# Patient Record
Sex: Male | Born: 1985 | Race: Black or African American | Hispanic: No | Marital: Single | Smoking: Current every day smoker
Health system: Southern US, Community
[De-identification: ages and names within clinical notes are randomized; demographics above are authoritative.]

## PROBLEM LIST (undated history)

## (undated) DIAGNOSIS — F1010 Alcohol abuse, uncomplicated: Secondary | ICD-10-CM

## (undated) DIAGNOSIS — I10 Essential (primary) hypertension: Secondary | ICD-10-CM

---
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
## 2007-03-02 ENCOUNTER — Emergency Department (HOSPITAL_COMMUNITY): Admission: EM | Admit: 2007-03-02 | Discharge: 2007-03-02 | Payer: Self-pay | Admitting: Emergency Medicine

## 2007-07-12 ENCOUNTER — Emergency Department (HOSPITAL_COMMUNITY): Admission: EM | Admit: 2007-07-12 | Discharge: 2007-07-12 | Payer: Self-pay | Admitting: Emergency Medicine

## 2008-05-29 ENCOUNTER — Emergency Department (HOSPITAL_COMMUNITY): Admission: EM | Admit: 2008-05-29 | Discharge: 2008-05-29 | Payer: Self-pay | Admitting: Emergency Medicine

## 2013-08-02 ENCOUNTER — Emergency Department (HOSPITAL_COMMUNITY): Payer: Self-pay

## 2013-08-02 ENCOUNTER — Encounter (HOSPITAL_COMMUNITY): Payer: Self-pay | Admitting: Emergency Medicine

## 2013-08-02 ENCOUNTER — Emergency Department (HOSPITAL_COMMUNITY)
Admission: EM | Admit: 2013-08-02 | Discharge: 2013-08-02 | Disposition: A | Discharge status: Home / Self Care | Payer: Self-pay | Attending: Emergency Medicine | Admitting: Emergency Medicine

## 2013-08-02 DIAGNOSIS — Y9389 Activity, other specified: Secondary | ICD-10-CM | POA: Insufficient documentation

## 2013-08-02 DIAGNOSIS — Y929 Unspecified place or not applicable: Secondary | ICD-10-CM | POA: Insufficient documentation

## 2013-08-02 DIAGNOSIS — S0181XA Laceration without foreign body of other part of head, initial encounter: Secondary | ICD-10-CM

## 2013-08-02 DIAGNOSIS — S0990XA Unspecified injury of head, initial encounter: Secondary | ICD-10-CM | POA: Insufficient documentation

## 2013-08-02 DIAGNOSIS — F17200 Nicotine dependence, unspecified, uncomplicated: Secondary | ICD-10-CM | POA: Insufficient documentation

## 2013-08-02 DIAGNOSIS — W2209XA Striking against other stationary object, initial encounter: Secondary | ICD-10-CM | POA: Insufficient documentation

## 2013-08-02 DIAGNOSIS — S0180XA Unspecified open wound of other part of head, initial encounter: Secondary | ICD-10-CM | POA: Insufficient documentation

## 2013-08-02 MED ORDER — TETANUS-DIPHTH-ACELL PERTUSSIS 5-2.5-18.5 LF-MCG/0.5 IM SUSP
0.5000 mL | Freq: Once | INTRAMUSCULAR | Status: AC
  Administered 2013-08-02: 0.5 mL via INTRAMUSCULAR
  Filled 2013-08-02: qty 0.5

## 2013-08-02 MED ORDER — LIDOCAINE-EPINEPHRINE (PF) 1 %-1:200000 IJ SOLN
INTRAMUSCULAR | Status: AC
  Administered 2013-08-02: 07:00:00
  Filled 2013-08-02: qty 10

## 2013-08-02 NOTE — Discharge Instructions (Signed)

## 2013-08-02 NOTE — ED Notes (Signed)
Pressure bandage applied to laceration. Pt reports ETOH on board.
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
## 2019-01-13 ENCOUNTER — Other Ambulatory Visit: Payer: Self-pay

## 2019-01-13 ENCOUNTER — Ambulatory Visit: Payer: Self-pay | Attending: Internal Medicine

## 2019-01-13 DIAGNOSIS — Z20822 Contact with and (suspected) exposure to covid-19: Secondary | ICD-10-CM | POA: Insufficient documentation

## 2019-01-15 ENCOUNTER — Telehealth: Payer: Self-pay | Admitting: *Deleted

## 2019-01-15 LAB — NOVEL CORONAVIRUS, NAA: SARS-CoV-2, NAA: NOT DETECTED

## 2019-01-15 NOTE — Telephone Encounter (Signed)
Patient called given negative covid results . 

## 2019-11-24 ENCOUNTER — Emergency Department (HOSPITAL_COMMUNITY)
Admission: EM | Admit: 2019-11-24 | Discharge: 2019-11-24 | Disposition: A | Discharge status: Home / Self Care | Payer: Self-pay | Attending: Emergency Medicine | Admitting: Emergency Medicine

## 2019-11-24 ENCOUNTER — Other Ambulatory Visit: Payer: Self-pay

## 2019-11-24 ENCOUNTER — Encounter (HOSPITAL_COMMUNITY): Payer: Self-pay | Admitting: *Deleted

## 2019-11-24 DIAGNOSIS — F17200 Nicotine dependence, unspecified, uncomplicated: Secondary | ICD-10-CM | POA: Insufficient documentation

## 2019-11-24 DIAGNOSIS — Z202 Contact with and (suspected) exposure to infections with a predominantly sexual mode of transmission: Secondary | ICD-10-CM | POA: Insufficient documentation

## 2019-11-24 LAB — URINALYSIS, ROUTINE W REFLEX MICROSCOPIC
Bilirubin Urine: NEGATIVE
Glucose, UA: NEGATIVE mg/dL
Hgb urine dipstick: NEGATIVE
Ketones, ur: NEGATIVE mg/dL
Leukocytes,Ua: NEGATIVE
Nitrite: NEGATIVE
Protein, ur: NEGATIVE mg/dL
Specific Gravity, Urine: 1.002 — ABNORMAL LOW (ref 1.005–1.030)
pH: 6 (ref 5.0–8.0)

## 2019-11-24 LAB — HIV ANTIBODY (ROUTINE TESTING W REFLEX): HIV Screen 4th Generation wRfx: NONREACTIVE

## 2019-11-24 MED ORDER — DOXYCYCLINE HYCLATE 100 MG PO CAPS
100.0000 mg | ORAL_CAPSULE | Freq: Two times a day (BID) | ORAL | 0 refills | Status: AC
Start: 2019-11-24 — End: 2019-12-01

## 2019-11-24 MED ORDER — METRONIDAZOLE 500 MG PO TABS: 500.0000 mg | ORAL_TABLET | Freq: Two times a day (BID) | ORAL | 0 refills | Status: DC

## 2019-11-24 MED ORDER — CEFTRIAXONE SODIUM 500 MG IJ SOLR
500.0000 mg | Freq: Once | INTRAMUSCULAR | Status: AC
  Administered 2019-11-24: 500 mg via INTRAMUSCULAR
  Filled 2019-11-24: qty 500

## 2019-11-24 MED ORDER — LIDOCAINE HCL (PF) 1 % IJ SOLN
1.0000 mL | Freq: Once | INTRAMUSCULAR | Status: AC
  Administered 2019-11-24: 1 mL

## 2019-11-24 NOTE — Discharge Instructions (Signed)
Seen here for a STD exposure.  Start you on antibiotics please be aware that doxycycline can make you more susceptible to sunburns please wear sunscreen to protect your face and skin.  I have also started you on Flagyl do not drink alcohol taking this medication as it can have a bad reaction.  Recommend that you abstain from sexual intercourse for 2 weeks times.  Your HIV and syphilis tests are pending at this time.  I have given you information for safe sex practices and you can also follow-up at the health department where they screened and treated for STIs.  Come back to the emergency department if you develop chest pain, shortness of breath, severe abdominal pain, uncontrolled nausea, vomiting, diarrhea.

## 2019-11-24 NOTE — ED Notes (Signed)
ED Provider at bedside. 

## 2019-11-24 NOTE — ED Notes (Signed)
Entered room and introduced self to patient. Pt appears in no acute distress, respirations are even and unlabored with equal chest rise and fall. Bed is locked in the lowest position, side rails x1 at patient request. Educated on call light use and hourly rounding, in agreement at this time. All questions and concerns voiced addressed.  Pt up and to restroom without assistance from staff for urine sample at this time.

## 2019-11-24 NOTE — ED Provider Notes (Signed)
[name]   CSN: [id number] Arrival date & time: 11/24/19  1156     History Chief Complaint  Patient presents with  . Exposure to STD    Carlos Good is a 34 y.o. male.  HPI   Patient with no significant medical history presents to the emergency department with chief complaint of possible STD exposure.  Patient stated that his girlfriend recently tested positive for gonorrhea and he want to be evaluated.  He denies any urinary symptoms, denies dysuria, hematuria, urgency, penile discharge, testicular pain.  He denies flank pain or abdominal pain, nausea, vomiting, diarrhea, no rashes or oral lesions that he is aware of.  Patient denies any alleviating or aggravating factors at this time.  Patient denies headaches, fevers, chills, shortness of breath, chest pain or diarrhea.   History reviewed. No pertinent past medical history.  There are no problems to display for this patient.   History reviewed. No pertinent surgical history.     History reviewed. No pertinent family history.  Social History   Tobacco Use  . Smoking status: Current Every Day Smoker    Packs/day: 1.00  . Smokeless tobacco: Never Used  Substance Use Topics  . Alcohol use: Yes  . Drug use: No    Home Medications Prior to Admission medications   Medication Sig Start Date End Date Taking? Authorizing Provider  doxycycline (VIBRAMYCIN) 100 MG capsule Take 1 capsule (100 mg total) by mouth 2 (two) times daily for 7 days. 11/24/19 12/01/19  Carroll Sage, PA-C  metroNIDAZOLE (FLAGYL) 500 MG tablet Take 1 tablet (500 mg total) by mouth 2 (two) times daily. 11/24/19   Carroll Sage, PA-C    Allergies    Patient has no known allergies.  Review of Systems   Review of Systems  Constitutional: Negative for chills and fever.  HENT: Negative for congestion.   Respiratory: Negative for shortness of breath.   Cardiovascular: Negative for chest pain.    Gastrointestinal: Negative for abdominal pain, diarrhea, nausea and vomiting.  Genitourinary: Negative for discharge, dysuria, enuresis, flank pain and scrotal swelling.  Musculoskeletal: Negative for back pain.  Skin: Negative for rash.  Neurological: Negative for headaches.  Hematological: Does not bruise/bleed easily.    Physical Exam Updated Vital Signs BP (!) 154/93 (BP Location: Right Arm)   Pulse 96   Temp 98.5 F (36.9 C) (Oral)   Resp 18   Ht 6\' 3"  (1.905 m)   Wt 79.4 kg   SpO2 99%   BMI 21.87 kg/m   Physical Exam Vitals and nursing note reviewed. Exam conducted with a chaperone present.  Constitutional:      General: He is not in acute distress.    Appearance: He is not ill-appearing.  HENT:     Head: Normocephalic and atraumatic.     Nose: No congestion.     Mouth/Throat:     Mouth: Mucous membranes are moist.     Pharynx: Oropharynx is clear. No oropharyngeal exudate or posterior oropharyngeal erythema.  Eyes:     Conjunctiva/sclera: Conjunctivae normal.  Cardiovascular:     Rate and Rhythm: Normal rate and regular rhythm.     Pulses: Normal pulses.     Heart sounds: No murmur heard.  No friction rub. No gallop.   Pulmonary:     Effort: No respiratory distress.     Breath sounds: No wheezing, rhonchi or rales.  Genitourinary:    Penis: Normal.  Testes: Normal.     Comments: With chaperone present genital exam was performed there is no lesions, rashes, penile discharge noted.  Testicles were palpated they are nontender to palpation, no bell clap deformity noted. Musculoskeletal:     Comments: Moving all 4 extremities out difficulty.  Skin:    General: Skin is warm and dry.     Findings: No lesion or rash.     Comments: Skin exam was performed there was no noted lesions, rashes, lacerations, abrasions ecchymosis or other gross abnormalities identified.  Neurological:     Mental Status: He is alert.     Comments: No difficulty with word finding.   Psychiatric:        Mood and Affect: Mood normal.     ED Results / Procedures / Treatments   Labs (all labs ordered are listed, but only abnormal results are displayed) Labs Reviewed  URINALYSIS, ROUTINE W REFLEX MICROSCOPIC - Abnormal; Notable for the following components:      Result Value   Color, Urine COLORLESS (*)    Specific Gravity, Urine 1.002 (*)    All other components within normal limits  RPR  HIV ANTIBODY (ROUTINE TESTING W REFLEX)  GC/CHLAMYDIA PROBE AMP ([employer]) NOT AT [facility]    EKG None  Radiology No results found.  Procedures Procedures (including critical care time)  Medications Ordered in ED Medications  cefTRIAXone (ROCEPHIN) injection 500 mg (500 mg Intramuscular Given 11/24/19 1316)  lidocaine (PF) (XYLOCAINE) 1 % injection 1 mL (1 mL Other Given 11/24/19 1316)    ED Course  I have reviewed the triage vital signs and the nursing notes.  Pertinent labs & imaging results that were available during my care of the patient were reviewed by me and considered in my medical decision making (see chart for details).    MDM Rules/Calculators/A&P                          Patient presents with exposure to STI.  He is alert, does not appear in acute distress, will order STD work-up provide him with IM of Rocephin and outpatient medication.   Patient's UA was negative for hematuria, nitrates, leukocytes, bacteria  Low suspicion for pyelonephritis or UTI as patient denies urinary symptoms, lower back pain, nausea vomiting fevers or chills.  Patient's UA was negative for hematuria, nitrates, leukocytes, bacteria. low suspicion for epididymitis or prostatitis as patient denies saddle pain, testicles were nontender.  Low suspicion for herpes as there is no noted rash on patient's genitalia.  Low suspicion for systemic infection as patient is nontoxic-appearing, vital signs reassuring, no obvious source infection noted on exam.  Will provide patient with IM of  Rocephin due to exposure to gonorrhea and start him on doxycycline possible resistance to gonorrhea and chlamydia, Flagyl for possible trichomonas.  Will recommend patient abstain from sexual intercourse for 2 weeks and encourage safe sex practices.  Vital signs have remained stable, no indication for hospital admission.  Patient discussed with attending and they agreed with assessment and plan.  Patient given at home care as well strict return precautions.  Patient verbalized that they understood agreed to said plan.   Final Clinical Impression(s) / ED Diagnoses Final diagnoses:  STD exposure    Rx / DC Orders ED Discharge Orders         Ordered    doxycycline (VIBRAMYCIN) 100 MG capsule  2 times daily        11/24/19  1349    metroNIDAZOLE (FLAGYL) 500 MG tablet  2 times daily        11/24/19 1349           Barnie Del 11/24/19 1407    Eber Hong, MD 11/27/19 1510

## 2019-11-24 NOTE — ED Notes (Signed)
Urine sample labeled with patient identification sticker after using two patient identifiers at this time.
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
## 2019-11-25 LAB — RPR: RPR Ser Ql: NONREACTIVE

## 2020-03-15 ENCOUNTER — Other Ambulatory Visit: Payer: Self-pay

## 2020-03-15 ENCOUNTER — Emergency Department (HOSPITAL_COMMUNITY)
Admission: EM | Admit: 2020-03-15 | Discharge: 2020-03-15 | Disposition: A | Discharge status: Home / Self Care | Payer: Self-pay | Attending: Emergency Medicine | Admitting: Emergency Medicine

## 2020-03-15 ENCOUNTER — Encounter (HOSPITAL_COMMUNITY): Payer: Self-pay | Admitting: *Deleted

## 2020-03-15 DIAGNOSIS — R110 Nausea: Secondary | ICD-10-CM | POA: Insufficient documentation

## 2020-03-15 DIAGNOSIS — F17200 Nicotine dependence, unspecified, uncomplicated: Secondary | ICD-10-CM | POA: Insufficient documentation

## 2020-03-15 DIAGNOSIS — R1013 Epigastric pain: Secondary | ICD-10-CM | POA: Insufficient documentation

## 2020-03-15 LAB — URINALYSIS, ROUTINE W REFLEX MICROSCOPIC
Bacteria, UA: NONE SEEN
Bilirubin Urine: NEGATIVE
Glucose, UA: NEGATIVE mg/dL
Hgb urine dipstick: NEGATIVE
Ketones, ur: 20 mg/dL — AB
Leukocytes,Ua: NEGATIVE
Nitrite: NEGATIVE
Protein, ur: 30 mg/dL — AB
Specific Gravity, Urine: 1.03 (ref 1.005–1.030)
pH: 5 (ref 5.0–8.0)

## 2020-03-15 LAB — COMPREHENSIVE METABOLIC PANEL
ALT: 87 U/L — ABNORMAL HIGH (ref 0–44)
AST: 83 U/L — ABNORMAL HIGH (ref 15–41)
Albumin: 4.1 g/dL (ref 3.5–5.0)
Alkaline Phosphatase: 48 U/L (ref 38–126)
Anion gap: 15 (ref 5–15)
BUN: 11 mg/dL (ref 6–20)
CO2: 21 mmol/L — ABNORMAL LOW (ref 22–32)
Calcium: 8.9 mg/dL (ref 8.9–10.3)
Chloride: 99 mmol/L (ref 98–111)
Creatinine, Ser: 0.65 mg/dL (ref 0.61–1.24)
GFR, Estimated: >60 mL/min (ref >60)
Glucose, Bld: 79 mg/dL (ref 70–99)
Potassium: 3.7 mmol/L (ref 3.5–5.1)
Sodium: 135 mmol/L (ref 135–145)
Total Bilirubin: 0.6 mg/dL (ref 0.3–1.2)
Total Protein: 7.3 g/dL (ref 6.5–8.1)

## 2020-03-15 LAB — CBC
HCT: 42.3 % (ref 39.0–52.0)
Hemoglobin: 13.9 g/dL (ref 13.0–17.0)
MCH: 33.4 pg (ref 26.0–34.0)
MCHC: 32.9 g/dL (ref 30.0–36.0)
MCV: 101.7 fL — ABNORMAL HIGH (ref 80.0–100.0)
Platelets: 224 10*3/uL (ref 150–400)
RBC: 4.16 MIL/uL — ABNORMAL LOW (ref 4.22–5.81)
RDW: 15.1 % (ref 11.5–15.5)
WBC: 5.2 10*3/uL (ref 4.0–10.5)
nRBC: 0 % (ref 0.0–0.2)

## 2020-03-15 LAB — LIPASE, BLOOD: Lipase: 43 U/L (ref 11–51)

## 2020-03-15 MED ORDER — OMEPRAZOLE 20 MG PO CPDR: 20.0000 mg | DELAYED_RELEASE_CAPSULE | Freq: Two times a day (BID) | ORAL | 0 refills | Status: DC

## 2020-03-15 MED ORDER — ONDANSETRON 4 MG PO TBDP: ORAL_TABLET | ORAL | 0 refills | Status: DC

## 2020-03-15 MED ORDER — LIDOCAINE VISCOUS HCL 2 % MT SOLN
15.0000 mL | Freq: Once | OROMUCOSAL | Status: AC
  Administered 2020-03-15: 15 mL via ORAL
  Filled 2020-03-15: qty 15

## 2020-03-15 MED ORDER — SODIUM CHLORIDE 0.9 % IV BOLUS
1000.0000 mL | Freq: Once | INTRAVENOUS | Status: AC
  Administered 2020-03-15: 1000 mL via INTRAVENOUS

## 2020-03-15 MED ORDER — PANTOPRAZOLE SODIUM 40 MG IV SOLR
40.0000 mg | Freq: Once | INTRAVENOUS | Status: AC
  Administered 2020-03-15: 40 mg via INTRAVENOUS
  Filled 2020-03-15: qty 40

## 2020-03-15 MED ORDER — ONDANSETRON HCL 4 MG/2ML IJ SOLN
4.0000 mg | Freq: Once | INTRAMUSCULAR | Status: AC
  Administered 2020-03-15: 4 mg via INTRAVENOUS
  Filled 2020-03-15: qty 2

## 2020-03-15 MED ORDER — ALUM & MAG HYDROXIDE-SIMETH 200-200-20 MG/5ML PO SUSP
30.0000 mL | Freq: Once | ORAL | Status: AC
  Administered 2020-03-15: 30 mL via ORAL
  Filled 2020-03-15: qty 30

## 2020-03-15 NOTE — ED Provider Notes (Signed)
[place] EMERGENCY DEPARTMENT Provider Note   CSN: [id number] Arrival date & time: 03/15/20  1402     History Chief Complaint  Patient presents with  . Abdominal Pain    Carlos Good is a 35 y.o. male.  Carlos Good is a 35 y.o. male who is otherwise healthy, presents to the emergency department for evaluation of abdominal pain.  Patient reports that pain started last night around 8:30.  It is located in the epigastric region and is constant and not improving.  He describes it as a normal aching pain.  He is not sure that anything makes it better or worse, has not had much of an appetite due to this pain today.  No associated vomiting but some nausea.  No diarrhea, constipation, melena or hematochezia.  No dysuria or urinary frequency.  No fevers or chills.  No associated chest pain or shortness of breath.  Has never had similar pain.  No history of abdominal surgeries or abdominal issues.  Reports he does drink 2-3 beers daily and smokes about a pack a day but denies any other substance use.  No meds prior to arrival to treat symptoms.  No other aggravating or alleviating factors.        History reviewed. No pertinent past medical history.  There are no problems to display for this patient.   History reviewed. No pertinent surgical history.     No family history on file.  Social History   Tobacco Use  . Smoking status: Current Every Day Smoker    Packs/day: 1.00  . Smokeless tobacco: Never Used  Substance Use Topics  . Alcohol use: Yes  . Drug use: No    Home Medications Prior to Admission medications   Medication Sig Start Date End Date Taking? Authorizing Provider  metroNIDAZOLE (FLAGYL) 500 MG tablet Take 1 tablet (500 mg total) by mouth 2 (two) times daily. Patient not taking: Reported on 03/15/2020 11/24/19   Faulkner, William J, PA-C    Allergies    Patient has no known allergies.  Review of Systems   Review of Systems  Constitutional: Negative for  chills and fever.  HENT: Negative.   Respiratory: Negative for cough and shortness of breath.   Cardiovascular: Negative for chest pain.  Gastrointestinal: Positive for abdominal pain and nausea. Negative for blood in stool, constipation, diarrhea and vomiting.  Genitourinary: Negative for dysuria and frequency.  Musculoskeletal: Negative for arthralgias and myalgias.  Skin: Negative for color change and rash.  Neurological: Negative for dizziness, syncope and light-headedness.  All other systems reviewed and are negative.   Physical Exam Updated Vital Signs BP (!) 166/111 (BP Location: Right Arm)   Pulse 77   Temp 98.6 F (37 C) (Oral)   Resp 18   Ht 6' 3" (1.905 m)   Wt 77.1 kg   SpO2 97%   BMI 21.25 kg/m   Physical Exam Vitals and nursing note reviewed.  Constitutional:      General: He is not in acute distress.    Appearance: He is well-developed and normal weight. He is not ill-appearing or diaphoretic.     Comments: Well-appearing and in no distress   HENT:     Head: Normocephalic and atraumatic.     Mouth/Throat:     Mouth: Mucous membranes are moist.     Pharynx: Oropharynx is clear.  Eyes:     General:        Right eye: No discharge.          Left eye: No discharge.     Pupils: Pupils are equal, round, and reactive to light.  Cardiovascular:     Rate and Rhythm: Normal rate and regular rhythm.     Heart sounds: Normal heart sounds.  Pulmonary:     Effort: Pulmonary effort is normal. No respiratory distress.     Breath sounds: Normal breath sounds. No wheezing or rales.     Comments: Respirations equal and unlabored, patient able to speak in full sentences, lungs clear to auscultation bilaterally  Abdominal:     General: Bowel sounds are normal. There is no distension.     Palpations: Abdomen is soft. There is no mass.     Tenderness: There is abdominal tenderness in the epigastric area. There is no guarding.     Comments: Abdomen is soft, nondistended, bowel  sounds present throughout, there is some epigastric tenderness without guarding, all other quadrants nontender, no peritoneal signs.  Musculoskeletal:        General: No deformity.     Cervical back: Neck supple.  Skin:    General: Skin is warm and dry.     Capillary Refill: Capillary refill takes less than 2 seconds.  Neurological:     Mental Status: He is alert and oriented to person, place, and time.     Coordination: Coordination normal.     Comments: Speech is clear, able to follow commands Moves extremities without ataxia, coordination intact  Psychiatric:        Mood and Affect: Mood normal.        Behavior: Behavior normal.     ED Results / Procedures / Treatments   Labs (all labs ordered are listed, but only abnormal results are displayed) Labs Reviewed  COMPREHENSIVE METABOLIC PANEL - Abnormal; Notable for the following components:      Result Value   CO2 21 (*)    AST 83 (*)    ALT 87 (*)    All other components within normal limits  CBC - Abnormal; Notable for the following components:   RBC 4.16 (*)    MCV 101.7 (*)    All other components within normal limits  URINALYSIS, ROUTINE W REFLEX MICROSCOPIC - Abnormal; Notable for the following components:   Ketones, ur 20 (*)    Protein, ur 30 (*)    All other components within normal limits  LIPASE, BLOOD    EKG None  Radiology No results found.  Procedures Procedures   Medications Ordered in ED Medications  sodium chloride 0.9 % bolus 1,000 mL (0 mLs Intravenous Stopped 03/15/20 1842)  ondansetron (ZOFRAN) injection 4 mg (4 mg Intravenous Given 03/15/20 1656)  pantoprazole (PROTONIX) injection 40 mg (40 mg Intravenous Given 03/15/20 1656)  alum & mag hydroxide-simeth (MAALOX/MYLANTA) 200-200-20 MG/5ML suspension 30 mL (30 mLs Oral Given 03/15/20 1656)    And  lidocaine (XYLOCAINE) 2 % viscous mouth solution 15 mL (15 mLs Oral Given 03/15/20 1656)    ED Course  I have reviewed the triage vital signs and  the nursing notes.  Pertinent labs & imaging results that were available during my care of the patient were reviewed by me and considered in my medical decision making (see chart for details).    MDM Rules/Calculators/A&P                         Patient presents to the ED with complaints of abdominal pain. Patient nontoxic appearing, in no apparent distress, vitals WNL aside  from hypertension. On exam patient tender to in the epigastric region, all other quadrants nontender, no peritoneal signs. Will evaluate with labs, suspect GERD, gastritis, PUD, possibly pancreatitis, do not feel that abdominal imaging is indicated at this time. Analgesics, anti-emetics, and fluids administered.   I have independently ordered, reviewed and interpreted all labs and imaging: CBC: No leukocytosis, normal hemoglobin CMP: CO2 of 21, no other electrolyte derangements and normal renal function, very mild elevation of AST and ALT, normal alk phos and T bili Lipase: WNL UA: Mild ketones and proteinuria, but no signs of infection or hematuria  On repeat abdominal exam patient remains without peritoneal signs, doubt cholecystitis, pancreatitis, diverticulitis, appendicitis, bowel obstruction/perforation. Patient tolerating PO in the emergency department. Will discharge home with supportive measures. I discussed results, treatment plan, need for PCP follow-up, and return precautions with the patient. Provided opportunity for questions, patient confirmed understanding and is in agreement with plan.     Final Clinical Impression(s) / ED Diagnoses Final diagnoses:  Epigastric pain    Rx / DC Orders ED Discharge Orders         Ordered    omeprazole (PRILOSEC) 20 MG capsule  2 times daily before meals        03/15/20 1937    ondansetron (ZOFRAN ODT) 4 MG disintegrating tablet        03/15/20 1937           Jacqlyn Larsen, [place] 03/15/20 1953    Margette Fast, MD 03/15/20 2313

## 2020-03-15 NOTE — ED Triage Notes (Signed)
Abdominal pain onset last night intermittent, c/o hot and cold flashes

## 2020-03-15 NOTE — Discharge Instructions (Addendum)
I suspect your symptoms today are either due to acid reflux, gastritis or peptic ulcer disease.  Please use prescribed acid blocker omeprazole (Prilosec) twice daily before breakfast and dinner to help reduce the amount of acid your stomach makes.  You can use over-the-counter Maalox as needed for breakthrough symptoms.  You can use Zofran as needed for nausea and vomiting.  Please follow recommendations today online dietary modifications and please avoid alcohol and avoid medications such as ibuprofen, Aleve, Advil, or aspirin as these can worsen stomach irritation.  Your liver function tests were mildly elevated today and you should have these rechecked with the GI doctor or primary doctor.  Please follow-up with GI if symptoms are not improving.  Return for new or worsening pain, persistent vomiting, fevers or any other new or concerning symptoms.

## 2020-10-19 ENCOUNTER — Encounter: Payer: Self-pay | Admitting: Emergency Medicine

## 2020-10-19 ENCOUNTER — Other Ambulatory Visit: Payer: Self-pay

## 2020-10-19 ENCOUNTER — Ambulatory Visit
Admission: EM | Admit: 2020-10-19 | Discharge: 2020-10-19 | Disposition: A | Discharge status: Home / Self Care | Payer: Self-pay | Attending: Urgent Care | Admitting: Urgent Care

## 2020-10-19 DIAGNOSIS — I10 Essential (primary) hypertension: Secondary | ICD-10-CM

## 2020-10-19 DIAGNOSIS — K529 Noninfective gastroenteritis and colitis, unspecified: Secondary | ICD-10-CM

## 2020-10-19 DIAGNOSIS — I160 Hypertensive urgency: Secondary | ICD-10-CM

## 2020-10-19 DIAGNOSIS — R112 Nausea with vomiting, unspecified: Secondary | ICD-10-CM

## 2020-10-19 MED ORDER — AMLODIPINE BESYLATE 10 MG PO TABS: 10.0000 mg | ORAL_TABLET | Freq: Every day | ORAL | 0 refills | Status: DC

## 2020-10-19 MED ORDER — ONDANSETRON 4 MG PO TBDP
4.0000 mg | ORAL_TABLET | Freq: Once | ORAL | Status: AC
  Administered 2020-10-19: 4 mg via ORAL

## 2020-10-19 MED ORDER — ONDANSETRON 8 MG PO TBDP: 8.0000 mg | ORAL_TABLET | Freq: Three times a day (TID) | ORAL | 0 refills | Status: DC | PRN

## 2020-10-19 NOTE — ED Triage Notes (Signed)
Patient c/o emesis x 2 days.   Patient denies ABD pain, diarrhea, headache, fevers.   Patient states " I haven't been able to keep anything down for the past few days".   Patient endorses runny nose.  Patient hasn't taken any medications for symptoms.

## 2020-10-19 NOTE — Discharge Instructions (Addendum)
Make sure you push fluids drinking mostly water but mix it with Gatorade.  Try to eat light meals including soups, broths and soft foods, fruits.  You may use Zofran for your nausea and vomiting once every 8 hours.  Imodium can help with diarrhea but use this carefully limiting it to 1-2 times per day only if you are having a lot of diarrhea.  Please return to the clinic if symptoms worsen or you start having severe abdominal pain not helped by taking Tylenol or start having bloody stools or blood in the vomit.   Amlodipine is the blood pressure medication. For the first week, start taking 1/2 tablet once daily. After 1 week, start taking a full tablet daily. Follow up with one of the primary care doctors listed. Or contact your insurance carrier who you can see within your network.   For diabetes or elevated blood sugar, please make sure you are limiting and avoiding starchy, carbohydrate foods like pasta, breads, sweet breads, pastry, rice, potatoes, desserts. These foods can elevate your blood sugar. Also, limit and avoid drinks that contain a lot of sugar such as sodas, sweet teas, fruit juices.  Drinking plain water will be much more helpful, try 64 ounces of water daily.  It is okay to flavor your water naturally by cutting cucumber, lemon, mint or lime, placing it in a picture with water and drinking it over a period of 24-48 hours as long as it remains refrigerated.  For elevated blood pressure, make sure you are monitoring salt in your diet.  Do not eat restaurant foods and limit processed foods at home. I highly recommend you prepare and cook your own foods at home.  Processed foods include things like frozen meals, pre-seasoned meats and dinners, deli meats, canned foods as these foods contain a high amount of sodium/salt.  Make sure you are paying attention to sodium labels on foods you buy at the grocery store. Buy your spices separately such as garlic powder, onion powder, cumin, cayenne,  parsley flakes so that you can avoid seasonings that contain salt. However, salt-free seasonings are available and can be used, an example is Mrs. Dash and includes a lot of different mixtures that do not contain salt.  Lastly, when cooking using oils that are healthier for you is important. This includes olive oil, avocado oil, canola oil. We have discussed a lot of foods to avoid but below is a list of foods that can be very healthy to use in your diet whether it is for diabetes, cholesterol, high blood pressure, or in general healthy eating.  Salads - kale, spinach, cabbage, spring mix, arugula Fruits - avocadoes, berries (blueberries, raspberries, blackberries), apples, oranges, pomegranate, grapefruit, kiwi Vegetables - asparagus, cauliflower, broccoli, green beans, brussel sprouts, bell peppers, beets; stay away from or limit starchy vegetables like potatoes, carrots, peas Other general foods - kidney beans, egg whites, almonds, walnuts, sunflower seeds, pumpkin seeds, fat free yogurt, almond milk, flax seeds, quinoa, oats  Meat - It is better to eat lean meats and limit your red meat including pork to once a week.  Wild caught fish, chicken breast are good options as they tend to be leaner sources of good protein. Still be mindful of the sodium labels for the meats you buy.  DO NOT EAT ANY FOODS ON THIS LIST THAT YOU ARE ALLERGIC TO. For more specific needs, I highly recommend consulting a dietician or nutritionist but this can definitely be a good starting point.

## 2020-10-19 NOTE — ED Provider Notes (Signed)
[facility]   MRN: [id number] DOB: [birth year]  Subjective:   Carlos Good is a 35 y.o. male presenting for 2-day history of persistent nausea with vomiting.  He also had a mild headache that resolved yesterday.  No confusion, weakness, numbness or tingling, vision changes, hematemesis, diarrhea, bloody stools, chest pain, shortness of breath, diaphoresis, abdominal pain.  No history of GI issues.  No long distance travel outside the country, hospitalizations, recent antibiotic use.  Has 1 alcoholic beverage per day, smokes ~1 cigarette per day, no marijuana use, no drug use.  Has a history of elevated blood pressure readings but does not have a PCP.  Denies taking chronic medications.  No Known Allergies  History reviewed. No pertinent past medical history.   History reviewed. No pertinent surgical history.  Family history of HTN, MI and stroke with his mother in her 69's.   Social History   Tobacco Use   Smoking status: Every Day    Packs/day: 1.00    Types: Cigarettes   Smokeless tobacco: Never  Substance Use Topics   Alcohol use: Yes    Alcohol/week: 7.0 standard drinks    Types: 7 Cans of beer per week   Drug use: No    ROS   Objective:   Vitals: BP (!) 180/122 (BP Location: Right Arm)   Pulse 88   Temp 98.6 F (37 C) (Oral)   Resp 16   SpO2 96%   BP Readings from Last 3 Encounters:  10/19/20 (!) 180/122  03/15/20 (!) 178/110  11/24/19 (!) 154/93   Physical Exam Constitutional:      General: He is not in acute distress.    Appearance: Normal appearance. He is well-developed. He is not ill-appearing, toxic-appearing or diaphoretic.  HENT:     Head: Normocephalic and atraumatic.     Right Ear: External ear normal.     Left Ear: External ear normal.     Nose: Nose normal.     Mouth/Throat:     Mouth: Mucous membranes are moist.     Pharynx: Oropharynx is clear.  Eyes:     General: No scleral icterus.    Extraocular Movements:  Extraocular movements intact.     Pupils: Pupils are equal, round, and reactive to light.  Cardiovascular:     Rate and Rhythm: Normal rate and regular rhythm.     Heart sounds: Normal heart sounds. No murmur heard.   No friction rub. No gallop.  Pulmonary:     Effort: Pulmonary effort is normal. No respiratory distress.     Breath sounds: Normal breath sounds. No stridor. No wheezing, rhonchi or rales.  Abdominal:     General: Bowel sounds are normal. There is no distension.     Palpations: Abdomen is soft. There is no mass.     Tenderness: There is no abdominal tenderness. There is no guarding or rebound.  Skin:    General: Skin is warm and dry.  Neurological:     Mental Status: He is alert and oriented to person, place, and time.     Cranial Nerves: No cranial nerve deficit.     Motor: No weakness.     Coordination: Coordination normal.     Gait: Gait normal.     Deep Tendon Reflexes: Reflexes normal.     Comments: Negative Romberg and pronator drift.  No facial asymmetry.  Psychiatric:        Mood and Affect: Mood normal.  Behavior: Behavior normal.        Thought Content: Thought content normal.        Judgment: Judgment normal.   Patient given p.o. Zofran in clinic.  Assessment and Plan :   PDMP not reviewed this encounter.  1. Gastroenteritis   2. Nausea and vomiting, unspecified vomiting type   3. Essential hypertension   4. Hypertensive urgency    Will manage for gastroenteritis with supportive care, Zofran ODT, light meals.  Discussed his blood pressure at length.  At this time I do not suspect an acute encephalopathy, acute cardiopulmonary event and patient is largely asymptomatic.  Emphasized need for better blood pressure control with starting amlodipine, establishing care with a new PCP for follow-up and practicing a hypertensive friendly diet.  Maintain strict ER precautions. Counseled patient on potential for adverse effects with medications prescribed  today, patient verbalized understanding.    Wallis Bamberg, [place] 10/19/20 [phone]

## 2021-01-10 ENCOUNTER — Other Ambulatory Visit: Payer: Self-pay

## 2021-01-10 ENCOUNTER — Ambulatory Visit
Admission: EM | Admit: 2021-01-10 | Discharge: 2021-01-10 | Disposition: A | Discharge status: Home / Self Care | Payer: 59

## 2021-01-10 ENCOUNTER — Encounter: Payer: Self-pay | Admitting: Emergency Medicine

## 2021-01-10 DIAGNOSIS — R079 Chest pain, unspecified: Secondary | ICD-10-CM | POA: Diagnosis not present

## 2021-01-10 DIAGNOSIS — R55 Syncope and collapse: Secondary | ICD-10-CM

## 2021-01-10 DIAGNOSIS — R9431 Abnormal electrocardiogram [ECG] [EKG]: Secondary | ICD-10-CM | POA: Diagnosis not present

## 2021-01-10 DIAGNOSIS — I160 Hypertensive urgency: Secondary | ICD-10-CM

## 2021-01-10 HISTORY — DX: Essential (primary) hypertension: I10

## 2021-01-10 NOTE — ED Triage Notes (Addendum)
Pt reports had sudden onset chest tightness, and syncope on Friday. Pt reports sudden onset chest tightness this am around 430 am as well and reports episode lasted 25-30 minutes. Pt reports has been taking zofran, thinking it was bp medication. Pt reports last dose of bp medication x2 weeks ago. Pt denies chest pain, dizziness, shortness of breath at this time. Nad noted.PA at pt bedside.

## 2021-01-10 NOTE — ED Notes (Signed)
Pt refused EMS transport and reported would call father to transport to ED post discharge from UC. Provider aware and at bedside talking with patient about the importance of seeking higher level of care.

## 2021-01-10 NOTE — ED Provider Notes (Signed)
[facility]    CSN: [id number] Arrival date & time: 01/10/21  1600      History   Chief Complaint Chief Complaint  Patient presents with   Loss of Consciousness    HPI Carlos Good is a 36 y.o. male.   Patient presenting today with concern regarding an episode of syncope at home on Friday after an episode of sudden onset chest tightness and chest pain.  He states he was then asymptomatic for the next few days but then was woken up out of his sleep at 4:30 AM this morning with another episode of left-sided chest pain and pressure that lasted about 30 minutes.  He states it felt like it was his heart causing the pain.  He denies any exacerbating or alleviating factors.  Denies diaphoresis, nausea, left arm pain or jaw pain, injury to the area.  Of note, he has been taking Zofran instead of his blood pressure medication for the past 2 weeks and has not been monitoring his home blood pressures.  Denies any current chest pain, dizziness, shortness of breath, nausea, vomiting, mental status change.    Past Medical History:  Diagnosis Date   Hypertension     There are no problems to display for this patient.   History reviewed. No pertinent surgical history.     Home Medications    Prior to Admission medications   Medication Sig Start Date End Date Taking? Authorizing Provider  amLODipine (NORVASC) 10 MG tablet Take 1 tablet (10 mg total) by mouth daily. 10/19/20  Yes Wallis Bamberg, PA-C  ondansetron (ZOFRAN-ODT) 8 MG disintegrating tablet Take 1 tablet (8 mg total) by mouth every 8 (eight) hours as needed for nausea or vomiting. 10/19/20  Yes Wallis Bamberg, PA-C    Family History History reviewed. No pertinent family history.  Social History Social History   Tobacco Use   Smoking status: Every Day    Packs/day: 0.50    Types: Cigarettes   Smokeless tobacco: Never  Substance Use Topics   Alcohol use: Yes    Alcohol/week: 7.0 standard drinks    Types: 7 Cans  of beer per week    Comment: occ   Drug use: No     Allergies   Patient has no known allergies.   Review of Systems Review of Systems Per HPI  Physical Exam Triage Vital Signs ED Triage Vitals [01/10/21 1631]  Enc Vitals Group     BP (!) 181/111     Pulse Rate 82     Resp 18     Temp 98.8 F (37.1 C)     Temp Source Oral     SpO2 98 %     Weight 175 lb (79.4 kg)     Height 6\' 3"  (1.905 m)     Head Circumference      Peak Flow      Pain Score 0     Pain Loc      Pain Edu?      Excl. in GC?    No data found.  Updated Vital Signs BP (!) 181/111 (BP Location: Right Arm)    Pulse 82    Temp 98.8 F (37.1 C) (Oral)    Resp 18    Ht 6\' 3"  (1.905 m)    Wt 175 lb (79.4 kg)    SpO2 98%    BMI 21.87 kg/m   Visual Acuity Right Eye Distance:   Left Eye Distance:   Bilateral Distance:  Right Eye Near:   Left Eye Near:    Bilateral Near:      Exam abbreviated today as it was already determined that patient will go to the emergency department for further cardiac evaluation. Physical Exam Vitals and nursing note reviewed.  Constitutional:      Appearance: Normal appearance.  HENT:     Head: Atraumatic.     Mouth/Throat:     Mouth: Mucous membranes are moist.  Eyes:     Extraocular Movements: Extraocular movements intact.     Conjunctiva/sclera: Conjunctivae normal.  Cardiovascular:     Rate and Rhythm: Normal rate and regular rhythm.     Heart sounds: Normal heart sounds.  Pulmonary:     Effort: Pulmonary effort is normal. No respiratory distress.  Musculoskeletal:        General: Normal range of motion.     Cervical back: Normal range of motion and neck supple.  Skin:    General: Skin is warm and dry.  Neurological:     Mental Status: He is oriented to person, place, and time.  Psychiatric:        Mood and Affect: Mood normal.        Thought Content: Thought content normal.        Judgment: Judgment normal.     UC Treatments / Results  Labs (all  labs ordered are listed, but only abnormal results are displayed) Labs Reviewed - No data to display  EKG   Radiology No results found.  Procedures Procedures (including critical care time)  Medications Ordered in UC Medications - No data to display  Initial Impression / Assessment and Plan / UC Course  I have reviewed the triage vital signs and the nursing notes.  Pertinent labs & imaging results that were available during my care of the patient were reviewed by me and considered in my medical decision making (see chart for details).     Significantly hypertensive in triage, otherwise vital signs benign and reassuring.  He is overall well-appearing in no acute distress, however given his chest pain, recent syncopal episode, abnormal EKG showing lateral T wave abnormalities with no previous EKG for comparison, will send to the emergency department for further evaluation and cardiac work-up.  He is agreeable to this and will call his dad to take him to the emergency department.    Final Clinical Impressions(s) / UC Diagnoses   Final diagnoses:  Syncope, unspecified syncope type  Chest pain, unspecified type  Hypertensive urgency  Abnormal EKG   Discharge Instructions   None    ED Prescriptions   None    PDMP not reviewed this encounter.   Particia Nearing, [place] 01/10/21 1711

## 2021-01-11 ENCOUNTER — Emergency Department (HOSPITAL_COMMUNITY)
Admission: EM | Admit: 2021-01-11 | Discharge: 2021-01-11 | Disposition: A | Discharge status: Home / Self Care | Payer: 59 | Attending: Emergency Medicine | Admitting: Emergency Medicine

## 2021-01-11 ENCOUNTER — Other Ambulatory Visit: Payer: Self-pay

## 2021-01-11 ENCOUNTER — Emergency Department (HOSPITAL_COMMUNITY): Payer: 59

## 2021-01-11 ENCOUNTER — Encounter (HOSPITAL_COMMUNITY): Payer: Self-pay

## 2021-01-11 DIAGNOSIS — I10 Essential (primary) hypertension: Secondary | ICD-10-CM | POA: Insufficient documentation

## 2021-01-11 DIAGNOSIS — Z79899 Other long term (current) drug therapy: Secondary | ICD-10-CM | POA: Insufficient documentation

## 2021-01-11 DIAGNOSIS — R0789 Other chest pain: Secondary | ICD-10-CM | POA: Insufficient documentation

## 2021-01-11 DIAGNOSIS — R55 Syncope and collapse: Secondary | ICD-10-CM | POA: Diagnosis not present

## 2021-01-11 DIAGNOSIS — R079 Chest pain, unspecified: Secondary | ICD-10-CM | POA: Diagnosis present

## 2021-01-11 LAB — CBC
HCT: 43.4 % (ref 39.0–52.0)
Hemoglobin: 14.7 g/dL (ref 13.0–17.0)
MCH: 33.6 pg (ref 26.0–34.0)
MCHC: 33.9 g/dL (ref 30.0–36.0)
MCV: 99.1 fL (ref 80.0–100.0)
Platelets: 224 10*3/uL (ref 150–400)
RBC: 4.38 MIL/uL (ref 4.22–5.81)
RDW: 14 % (ref 11.5–15.5)
WBC: 5.7 10*3/uL (ref 4.0–10.5)
nRBC: 0 % (ref 0.0–0.2)

## 2021-01-11 LAB — TROPONIN I (HIGH SENSITIVITY)
Troponin I (High Sensitivity): 4 ng/L (ref <18)
Troponin I (High Sensitivity): 4 ng/L (ref <18)

## 2021-01-11 LAB — BASIC METABOLIC PANEL
Anion gap: 9 (ref 5–15)
BUN: 10 mg/dL (ref 6–20)
CO2: 25 mmol/L (ref 22–32)
Calcium: 9.1 mg/dL (ref 8.9–10.3)
Chloride: 101 mmol/L (ref 98–111)
Creatinine, Ser: 0.66 mg/dL (ref 0.61–1.24)
GFR, Estimated: >60 mL/min (ref >60)
Glucose, Bld: 88 mg/dL (ref 70–99)
Potassium: 3.9 mmol/L (ref 3.5–5.1)
Sodium: 135 mmol/L (ref 135–145)

## 2021-01-11 LAB — D-DIMER, QUANTITATIVE: D-Dimer, Quant: 0.31 ug/mL-FEU (ref 0.00–0.50)

## 2021-01-11 NOTE — ED Triage Notes (Signed)
Pt presents to ED, sent by Urgent Care for abnormal EKG. Pt states he was seen at Urgent Care because he had a syncopal episode on Friday. Yesterday he had sudden onset of chest tightness. Hasn't had BP meds in 3-4 days per patient. Pt denies chest pain or shortness of breath at this time.

## 2021-01-11 NOTE — Discharge Instructions (Signed)
Lab work and imaging are reassuring please continue with medication as prescribed.  Follow-up cardiologist for further evaluation  Come back to the emergency department if you develop chest pain, shortness of breath, severe abdominal pain, uncontrolled nausea, vomiting, diarrhea.

## 2021-01-11 NOTE — ED Provider Notes (Signed)
[facility] Provider Note   CSN: [id number] Arrival date & time: 01/11/21  1725     History  No chief complaint on file.   Carlos Good is a 36 y.o. male.  HPI  Patient with medical history including hypertension presents with chief complaint of chest pain.  He states he had chest pain yesterday states it is in the middle of his chest did not radiate last approximate 30 minutes and resolve on its own, describes a pressure-like sensation, he is not had this pain since yesterday.  He does note that on Friday he had a syncopal episode, states he stood up the room went black and he fell over, patient not hit his head, he is on anticoagulants..  Prior to that he denies chest pain shortness of breath denies  headaches change in vision paresthesia or weakness of the lower extremities, denies seizure-like activity, did not become incontinent no tongue no history of seizure disorders.  He states that he was given his Zofran thinks he switched it with his BP medication and dropped his blood pressure.  He states that he went to urgent care yesterday to be evaluated and noted that his EKG was abnormal and sent for further evaluation.  He has no complaints at this time denies any illicit drug use, no history of PEs or DVTs currently not on hormone therapy, no significant cardiac history, no hyperlipidemia, no family history of cardiac abnormalities.  Home Medications Prior to Admission medications   Medication Sig Start Date End Date Taking? Authorizing Provider  amLODipine (NORVASC) 10 MG tablet Take 1 tablet (10 mg total) by mouth daily. 10/19/20  Yes Jaynee Eagles, PA-C  ondansetron (ZOFRAN-ODT) 8 MG disintegrating tablet Take 1 tablet (8 mg total) by mouth every 8 (eight) hours as needed for nausea or vomiting. 10/19/20  Yes Jaynee Eagles, PA-C      Allergies    Patient has no known allergies.    Review of Systems   Review of Systems  Constitutional:  Negative for chills and  fever.  Respiratory:  Negative for shortness of breath.   Cardiovascular:  Negative for chest pain and palpitations.  Gastrointestinal:  Negative for abdominal pain.  Neurological:  Negative for headaches.   Physical Exam Updated Vital Signs BP (!) 154/140    Pulse 71    Temp 98 F (36.7 C) (Oral)    Resp 17    Ht 6\' 3"  (1.905 m)    Wt 79.4 kg    SpO2 99%    BMI 21.87 kg/m  Physical Exam Vitals and nursing note reviewed.  Constitutional:      General: He is not in acute distress.    Appearance: He is not ill-appearing.  HENT:     Head: Normocephalic and atraumatic.     Nose: No congestion.  Eyes:     Conjunctiva/sclera: Conjunctivae normal.  Cardiovascular:     Rate and Rhythm: Normal rate and regular rhythm.     Pulses: Normal pulses.     Heart sounds: No murmur heard.   No friction rub. No gallop.  Pulmonary:     Effort: No respiratory distress.     Breath sounds: No wheezing, rhonchi or rales.  Abdominal:     Palpations: Abdomen is soft.     Tenderness: There is no right CVA tenderness.  Musculoskeletal:     Right lower leg: No edema.     Left lower leg: No edema.  Skin:    General: Skin  is warm and dry.  Neurological:     Mental Status: He is alert.     Comments: No facial asymmetry, no difficult word finding, no several words, able follow two-step commands, no unilateral weakness present.  Psychiatric:        Mood and Affect: Mood normal.    ED Results / Procedures / Treatments   Labs (all labs ordered are listed, but only abnormal results are displayed) Labs Reviewed  BASIC METABOLIC PANEL  CBC  D-DIMER, QUANTITATIVE  TROPONIN I (HIGH SENSITIVITY)  TROPONIN I (HIGH SENSITIVITY)    EKG EKG Interpretation  Date/Time:  Wednesday January 11 2021 17:35:23 EST Ventricular Rate:  86 PR Interval:  136 QRS Duration: 102 QT Interval:  354 QTC Calculation: 423 R Axis:   78 Text Interpretation: Normal sinus rhythm Right atrial enlargement Incomplete right  bundle branch block Minimal voltage criteria for LVH, may be normal variant ( Sokolow-Lyon ) Nonspecific T wave abnormality Abnormal ECG When compared with ECG of 10-Jan-2021 16:31, Incomplete right bundle branch block is now Present Confirmed by Fredia Sorrow [phone]) on 01/11/2021 9:17:55 PM  Radiology DG Chest 2 View  Result Date: 01/11/2021 CLINICAL DATA:  Chest pain EXAM: CHEST - 2 VIEW COMPARISON:  03/02/2007. FINDINGS: Cardiac and mediastinal contours are within normal limits. No focal pulmonary opacity. No pleural effusion or pneumothorax. No acute osseous abnormality. IMPRESSION: No acute cardiopulmonary process. Electronically Signed   By: Merilyn Baba M.D.   On: 01/11/2021 18:09    Procedures Procedures    Medications Ordered in ED Medications - No data to display  ED Course/ Medical Decision Making/ A&P                           Medical Decision Making  This patient presents to the ED for concern of chest pain, this involves an extensive number of treatment options, and is a complaint that carries with it a high risk of complications and morbidity.  The differential diagnosis includes PE, ACS    Additional history obtained:  Additional history obtained from electronic medical record External records from outside source obtained and reviewed including presents urgent care note   Co morbidities that complicate the patient evaluation  Hypertension  Social Determinants of Health:  N/A   Lab Tests:  I Ordered, and personally interpreted labs.  The pertinent results include: CBC unremarkable BMP unremarkable D-dimer 0.38 negative delta troponins   Imaging Studies ordered:  I ordered imaging studies including chest x-ray  I independently visualized and interpreted imaging which showed unremarkable I agree with the radiologist interpretation   Cardiac Monitoring:  The patient was maintained on a cardiac monitor.  I personally viewed and interpreted the  cardiac monitored which showed an underlying rhythm of: EKG sinus without signs of ischemia  Reevaluation:  Reassessed has no complaints updated lab work and imaging he is agreeable for discharge.    Rule out I have low suspicion for ACS as history is atypical, patient has no cardiac history, EKG was sinus rhythm without signs of ischemia, patient had a negative delta troponin.  Low suspicion for PE as patient denies pleuritic chest pain, shortness of breath, patient denies leg pain, no pedal edema noted on exam, patient was PERC negative, D-dimer is negative.  Low suspicion for AAA or aortic dissection as history is atypical, patient has low risk factors.  Low suspicion for intracranial head bleed and or CVA not on anticoagulant, no head trauma, denies  headaches, change in vision, paresthesia weakness upper lower extremities, no focal deficit present on exam.  Low suspicion for seizure as presentation atypical etiology.  low suspicion for systemic infection as patient is nontoxic-appearing, vital signs reassuring, no obvious source infection noted on exam.     Dispostion and problem list  After consideration of the diagnostic results and the patients response to treatment, I feel that the patent would benefit from   Chest pain since resolved-unclear etiology possible is from switching his blood pressure medication with antiemetics because he has hypertension we will have him follow-up with cardiology for further evaluation.  Given strict return precautions.             Final Clinical Impression(s) / ED Diagnoses Final diagnoses:  Atypical chest pain    Rx / DC Orders ED Discharge Orders     None         Aron Baba 01/11/21 2120    Fredia Sorrow, MD 01/19/21 1034

## 2021-01-12 ENCOUNTER — Telehealth (HOSPITAL_COMMUNITY): Payer: Self-pay | Admitting: Emergency Medicine

## 2021-01-12 MED ORDER — AMLODIPINE BESYLATE 10 MG PO TABS: 10.0000 mg | ORAL_TABLET | Freq: Every day | ORAL | 2 refills | Status: DC

## 2021-01-12 NOTE — Telephone Encounter (Signed)
Prescription called in for patient's blood pressure medication sent to his pharmacy

## 2021-11-18 ENCOUNTER — Ambulatory Visit
Admission: EM | Admit: 2021-11-18 | Discharge: 2021-11-18 | Disposition: A | Discharge status: Home / Self Care | Payer: Self-pay | Attending: Family Medicine | Admitting: Family Medicine

## 2021-11-18 DIAGNOSIS — Z202 Contact with and (suspected) exposure to infections with a predominantly sexual mode of transmission: Secondary | ICD-10-CM | POA: Insufficient documentation

## 2021-11-18 LAB — POCT URINALYSIS DIP (MANUAL ENTRY)
Glucose, UA: NEGATIVE mg/dL
Leukocytes, UA: NEGATIVE
Nitrite, UA: NEGATIVE
Protein Ur, POC: 100 mg/dL — AB
Spec Grav, UA: >=1.03 — AB (ref 1.010–1.025)
Urobilinogen, UA: 1 E.U./dL
pH, UA: 6 (ref 5.0–8.0)

## 2021-11-18 MED ORDER — METRONIDAZOLE 500 MG PO TABS: 2000.0000 mg | ORAL_TABLET | Freq: Once | ORAL | 0 refills | Status: AC

## 2021-11-18 NOTE — ED Triage Notes (Signed)
Pt was exposed to tric  so he wants to get tested. He is experiencing stomach pains off and off for 4 days.  Has a little bit of a nauseas feeling.  Needs std testing

## 2021-11-20 LAB — CYTOLOGY, (ORAL, ANAL, URETHRAL) ANCILLARY ONLY
Chlamydia: NEGATIVE
Comment: NEGATIVE
Comment: NEGATIVE
Comment: NORMAL
Neisseria Gonorrhea: NEGATIVE
Trichomonas: POSITIVE — AB

## 2021-11-22 NOTE — ED Provider Notes (Signed)
[facility]    CSN: [id number] Arrival date & time: 11/18/21  1530      History   Chief Complaint No chief complaint on file.   HPI Carlos Good is a 36 y.o. male.   Patient presenting today requesting STD testing as his partner tested positive for trichomonas.  He has had some nausea off and on but unsure if it is related, no penile discharge, itching, dysuria, rashes, lesions.  Not tried anything for symptoms.    Past Medical History:  Diagnosis Date   Hypertension     There are no problems to display for this patient.   History reviewed. No pertinent surgical history.     Home Medications    Prior to Admission medications   Medication Sig Start Date End Date Taking? Authorizing Provider  amLODipine (NORVASC) 10 MG tablet Take 1 tablet (10 mg total) by mouth daily. 10/19/20   Wallis Bamberg, PA-C  amLODipine (NORVASC) 10 MG tablet Take 1 tablet (10 mg total) by mouth daily. 01/12/21   Eber Hong, MD  ondansetron (ZOFRAN-ODT) 8 MG disintegrating tablet Take 1 tablet (8 mg total) by mouth every 8 (eight) hours as needed for nausea or vomiting. 10/19/20   Wallis Bamberg, PA-C    Family History History reviewed. No pertinent family history.  Social History Social History   Tobacco Use   Smoking status: Every Day    Packs/day: 0.50    Types: Cigarettes   Smokeless tobacco: Never  Substance Use Topics   Alcohol use: Yes    Alcohol/week: 7.0 standard drinks of alcohol    Types: 7 Cans of beer per week    Comment: occ   Drug use: No     Allergies   Patient has no allergy information on record.   Review of Systems Review of Systems Per HPI  Physical Exam Triage Vital Signs ED Triage Vitals  Enc Vitals Group     BP 11/18/21 1547 (!) 140/86     Pulse Rate 11/18/21 1547 91     Resp 11/18/21 1547 20     Temp 11/18/21 1547 98.5 F (36.9 C)     Temp Source 11/18/21 1547 Oral     SpO2 11/18/21 1547 94 %     Weight --      Height --       Head Circumference --      Peak Flow --      Pain Score 11/18/21 1551 0     Pain Loc --      Pain Edu? --      Excl. in GC? --    No data found.  Updated Vital Signs BP (!) 140/86 (BP Location: Right Arm)   Pulse 91   Temp 98.5 F (36.9 C) (Oral)   Resp 20   SpO2 94%   Visual Acuity Right Eye Distance:   Left Eye Distance:   Bilateral Distance:    Right Eye Near:   Left Eye Near:    Bilateral Near:     Physical Exam Vitals and nursing note reviewed.  Constitutional:      Appearance: Normal appearance.  HENT:     Head: Atraumatic.  Eyes:     Extraocular Movements: Extraocular movements intact.     Conjunctiva/sclera: Conjunctivae normal.  Cardiovascular:     Rate and Rhythm: Normal rate and regular rhythm.  Pulmonary:     Effort: Pulmonary effort is normal.     Breath sounds: Normal breath sounds.  Abdominal:     General: Bowel sounds are normal. There is no distension.     Palpations: Abdomen is soft.     Tenderness: There is no abdominal tenderness. There is no right CVA tenderness, left CVA tenderness or guarding.  Genitourinary:    Comments: GU exam deferred, self swab performed Musculoskeletal:        General: Normal range of motion.     Cervical back: Normal range of motion and neck supple.  Skin:    General: Skin is warm and dry.  Neurological:     General: No focal deficit present.     Mental Status: He is oriented to person, place, and time.  Psychiatric:        Mood and Affect: Mood normal.        Thought Content: Thought content normal.        Judgment: Judgment normal.      UC Treatments / Results  Labs (all labs ordered are listed, but only abnormal results are displayed) Labs Reviewed  POCT URINALYSIS DIP (MANUAL ENTRY) - Abnormal; Notable for the following components:      Result Value   Bilirubin, UA small (*)    Ketones, POC UA small (15) (*)    Spec Grav, UA >=1.030 (*)    Blood, UA trace-intact (*)    Protein Ur, POC =100 (*)     All other components within normal limits  CYTOLOGY, (ORAL, ANAL, URETHRAL) ANCILLARY ONLY - Abnormal; Notable for the following components:   Trichomonas Positive (*)    All other components within normal limits    EKG   Radiology No results found.  Procedures Procedures (including critical care time)  Medications Ordered in UC Medications - No data to display  Initial Impression / Assessment and Plan / UC Course  I have reviewed the triage vital signs and the nursing notes.  Pertinent labs & imaging results that were available during my care of the patient were reviewed by me and considered in my medical decision making (see chart for details).     Given known exposure, will treat for trichomonas today and send out cytology swab.  Discussed abstinence until results return and for at least 7 days after treatment to ensure infection is cleared if positive. Final Clinical Impressions(s) / UC Diagnoses   Final diagnoses:  Exposure to trichomonas   Discharge Instructions   None    ED Prescriptions     Medication Sig Dispense Auth. Provider   metroNIDAZOLE (FLAGYL) 500 MG tablet Take 4 tablets (2,000 mg total) by mouth once for 1 dose. 4 tablet Volney American, [place]      PDMP not reviewed this encounter.   Merrie Roof [place], [place] 11/22/21 [phone]

## 2023-04-04 ENCOUNTER — Other Ambulatory Visit: Payer: Self-pay

## 2023-04-04 ENCOUNTER — Emergency Department (HOSPITAL_COMMUNITY)
Admission: EM | Admit: 2023-04-04 | Discharge: 2023-04-04 | Disposition: A | Discharge status: Home / Self Care | Payer: Self-pay | Attending: Emergency Medicine | Admitting: Emergency Medicine

## 2023-04-04 ENCOUNTER — Encounter (HOSPITAL_COMMUNITY): Payer: Self-pay

## 2023-04-04 DIAGNOSIS — E876 Hypokalemia: Secondary | ICD-10-CM | POA: Insufficient documentation

## 2023-04-04 DIAGNOSIS — Z79899 Other long term (current) drug therapy: Secondary | ICD-10-CM | POA: Insufficient documentation

## 2023-04-04 DIAGNOSIS — F10239 Alcohol dependence with withdrawal, unspecified: Secondary | ICD-10-CM | POA: Insufficient documentation

## 2023-04-04 DIAGNOSIS — I10 Essential (primary) hypertension: Secondary | ICD-10-CM | POA: Insufficient documentation

## 2023-04-04 DIAGNOSIS — F10930 Alcohol use, unspecified with withdrawal, uncomplicated: Secondary | ICD-10-CM

## 2023-04-04 DIAGNOSIS — Y902 Blood alcohol level of 40-59 mg/100 ml: Secondary | ICD-10-CM | POA: Insufficient documentation

## 2023-04-04 HISTORY — DX: Alcohol abuse, uncomplicated: F10.10

## 2023-04-04 LAB — CBC WITH DIFFERENTIAL/PLATELET
Abs Immature Granulocytes: 0.01 10*3/uL (ref 0.00–0.07)
Basophils Absolute: 0.1 10*3/uL (ref 0.0–0.1)
Basophils Relative: 1 %
Eosinophils Absolute: 0 10*3/uL (ref 0.0–0.5)
Eosinophils Relative: 0 %
HCT: 40.8 % (ref 39.0–52.0)
Hemoglobin: 14.1 g/dL (ref 13.0–17.0)
Immature Granulocytes: 0 %
Lymphocytes Relative: 14 %
Lymphs Abs: 0.7 10*3/uL (ref 0.7–4.0)
MCH: 33.6 pg (ref 26.0–34.0)
MCHC: 34.6 g/dL (ref 30.0–36.0)
MCV: 97.1 fL (ref 80.0–100.0)
Monocytes Absolute: 0.5 10*3/uL (ref 0.1–1.0)
Monocytes Relative: 9 %
Neutro Abs: 3.7 10*3/uL (ref 1.7–7.7)
Neutrophils Relative %: 76 %
Platelets: 192 10*3/uL (ref 150–400)
RBC: 4.2 MIL/uL — ABNORMAL LOW (ref 4.22–5.81)
RDW: 14.6 % (ref 11.5–15.5)
WBC: 5 10*3/uL (ref 4.0–10.5)
nRBC: 0 % (ref 0.0–0.2)

## 2023-04-04 LAB — COMPREHENSIVE METABOLIC PANEL WITH GFR
ALT: 57 U/L — ABNORMAL HIGH (ref 0–44)
AST: 150 U/L — ABNORMAL HIGH (ref 15–41)
Albumin: 4.2 g/dL (ref 3.5–5.0)
Alkaline Phosphatase: 51 U/L (ref 38–126)
Anion gap: 18 — ABNORMAL HIGH (ref 5–15)
BUN: 8 mg/dL (ref 6–20)
CO2: 25 mmol/L (ref 22–32)
Calcium: 8.7 mg/dL — ABNORMAL LOW (ref 8.9–10.3)
Chloride: 93 mmol/L — ABNORMAL LOW (ref 98–111)
Creatinine, Ser: 0.5 mg/dL — ABNORMAL LOW (ref 0.61–1.24)
GFR, Estimated: >60 mL/min (ref >60)
Glucose, Bld: 103 mg/dL — ABNORMAL HIGH (ref 70–99)
Potassium: 3.1 mmol/L — ABNORMAL LOW (ref 3.5–5.1)
Sodium: 136 mmol/L (ref 135–145)
Total Bilirubin: 0.9 mg/dL (ref 0.0–1.2)
Total Protein: 7.8 g/dL (ref 6.5–8.1)

## 2023-04-04 LAB — URINALYSIS, ROUTINE W REFLEX MICROSCOPIC
Bacteria, UA: NONE SEEN
Bilirubin Urine: NEGATIVE
Glucose, UA: 50 mg/dL — AB
Ketones, ur: 5 mg/dL — AB
Leukocytes,Ua: NEGATIVE
Nitrite: NEGATIVE
Protein, ur: 100 mg/dL — AB
Specific Gravity, Urine: 1.017 (ref 1.005–1.030)
pH: 6 (ref 5.0–8.0)

## 2023-04-04 LAB — RAPID URINE DRUG SCREEN, HOSP PERFORMED
Amphetamines: NOT DETECTED
Barbiturates: NOT DETECTED
Benzodiazepines: POSITIVE — AB
Cocaine: NOT DETECTED
Opiates: NOT DETECTED
Tetrahydrocannabinol: POSITIVE — AB

## 2023-04-04 LAB — ETHANOL: Alcohol, Ethyl (B): 40 mg/dL — ABNORMAL HIGH (ref <10)

## 2023-04-04 LAB — MAGNESIUM: Magnesium: 1 mg/dL — ABNORMAL LOW (ref 1.7–2.4)

## 2023-04-04 MED ORDER — FOLIC ACID 1 MG PO TABS
1.0000 mg | ORAL_TABLET | Freq: Every day | ORAL | Status: DC
  Administered 2023-04-04: 1 mg via ORAL
  Filled 2023-04-04: qty 1

## 2023-04-04 MED ORDER — MAGNESIUM SULFATE 2 GM/50ML IV SOLN
2.0000 g | Freq: Once | INTRAVENOUS | Status: AC
  Administered 2023-04-04: 2 g via INTRAVENOUS
  Filled 2023-04-04: qty 50

## 2023-04-04 MED ORDER — THIAMINE HCL 100 MG/ML IJ SOLN
500.0000 mg | Freq: Once | INTRAVENOUS | Status: AC
  Administered 2023-04-04: 500 mg via INTRAVENOUS
  Filled 2023-04-04: qty 5

## 2023-04-04 MED ORDER — SODIUM CHLORIDE 0.9 % IV BOLUS
1000.0000 mL | Freq: Once | INTRAVENOUS | Status: AC
  Administered 2023-04-04: 1000 mL via INTRAVENOUS

## 2023-04-04 MED ORDER — POTASSIUM CHLORIDE CRYS ER 20 MEQ PO TBCR
40.0000 meq | EXTENDED_RELEASE_TABLET | Freq: Once | ORAL | Status: AC
Start: 2023-04-04 — End: 2023-04-04
  Administered 2023-04-04: 40 meq via ORAL
  Filled 2023-04-04: qty 2

## 2023-04-04 MED ORDER — LORAZEPAM 2 MG/ML IJ SOLN: 1.0000 mg | INTRAMUSCULAR | Status: DC | PRN

## 2023-04-04 MED ORDER — LORAZEPAM 1 MG PO TABS
1.0000 mg | ORAL_TABLET | ORAL | Status: DC | PRN
  Administered 2023-04-04: 1 mg via ORAL
  Filled 2023-04-04: qty 1

## 2023-04-04 MED ORDER — LORAZEPAM 2 MG/ML IJ SOLN
2.0000 mg | Freq: Once | INTRAMUSCULAR | Status: AC
  Administered 2023-04-04: 2 mg via INTRAVENOUS
  Filled 2023-04-04: qty 1

## 2023-04-04 MED ORDER — CHLORDIAZEPOXIDE HCL 25 MG PO CAPS: ORAL_CAPSULE | ORAL | 0 refills | Status: DC

## 2023-04-04 MED ORDER — PANTOPRAZOLE SODIUM 40 MG IV SOLR
40.0000 mg | Freq: Once | INTRAVENOUS | Status: AC
  Administered 2023-04-04: 40 mg via INTRAVENOUS
  Filled 2023-04-04: qty 10

## 2023-04-04 MED ORDER — ADULT MULTIVITAMIN W/MINERALS CH
1.0000 | ORAL_TABLET | Freq: Every day | ORAL | Status: DC
  Administered 2023-04-04: 1 via ORAL
  Filled 2023-04-04: qty 1

## 2023-04-04 NOTE — ED Provider Notes (Signed)
 [place] EMERGENCY DEPARTMENT AT [facility] Provider Note   CSN: [id number] Arrival date & time: 04/04/23  1054     History  Chief Complaint  Patient presents with   Alcohol Intoxication    Carlos Good is a 38 y.o. male.  He has a history of hypertension and alcohol abuse.  Drinks a pint a day.  He said today he only drank a shot but has been shaky and vomiting.  He said he has been trying to cut back recently.  No seizure.  No blood in the vomit.  The history is provided by the patient and a friend.  Emesis Severity:  Moderate Duration:  5 hours Timing:  Sporadic Associated symptoms: no abdominal pain, no fever and no sore throat   Risk factors: alcohol use        Home Medications Prior to Admission medications   Medication Sig Start Date End Date Taking? Authorizing Provider  amLODipine (NORVASC) 10 MG tablet Take 1 tablet (10 mg total) by mouth daily. 10/19/20   Wallis Bamberg, PA-C  amLODipine (NORVASC) 10 MG tablet Take 1 tablet (10 mg total) by mouth daily. 01/12/21   Eber Hong, MD  ondansetron (ZOFRAN-ODT) 8 MG disintegrating tablet Take 1 tablet (8 mg total) by mouth every 8 (eight) hours as needed for nausea or vomiting. 10/19/20   Wallis Bamberg, PA-C      Allergies    Patient has no allergy information on record.    Review of Systems   Review of Systems  Constitutional:  Negative for fever.  HENT:  Negative for sore throat.   Respiratory:  Negative for shortness of breath.   Cardiovascular:  Negative for chest pain.  Gastrointestinal:  Positive for nausea and vomiting. Negative for abdominal pain.  Genitourinary:  Negative for dysuria.  Skin:  Negative for rash.  Neurological:  Positive for tremors.    Physical Exam Updated Vital Signs BP 126/79 (BP Location: Right Arm)   Pulse 97   Temp 97.6 F (36.4 C) (Temporal)   Resp 16   Ht 6\' 3"  (1.905 m)   Wt 79.4 kg   SpO2 97%   BMI 21.88 kg/m  Physical Exam Vitals and nursing note  reviewed.  Constitutional:      General: He is not in acute distress.    Appearance: Normal appearance. He is well-developed.  HENT:     Head: Normocephalic and atraumatic.  Eyes:     Conjunctiva/sclera: Conjunctivae normal.  Cardiovascular:     Rate and Rhythm: Regular rhythm. Tachycardia present.     Heart sounds: No murmur heard. Pulmonary:     Effort: Pulmonary effort is normal. No respiratory distress.     Breath sounds: Normal breath sounds.  Abdominal:     Palpations: Abdomen is soft.     Tenderness: There is no abdominal tenderness. There is no guarding or rebound.  Musculoskeletal:        General: No deformity.     Cervical back: Neck supple.  Skin:    General: Skin is warm and dry.     Capillary Refill: Capillary refill takes less than 2 seconds.  Neurological:     General: No focal deficit present.     Mental Status: He is alert.     Sensory: No sensory deficit.     Motor: No weakness.     ED Results / Procedures / Treatments   Labs (all labs ordered are listed, but only abnormal results are displayed) Labs Reviewed  COMPREHENSIVE METABOLIC PANEL WITH GFR - Abnormal; Notable for the following components:      Result Value   Potassium 3.1 (*)    Chloride 93 (*)    Glucose, Bld 103 (*)    Creatinine, Ser 0.50 (*)    Calcium 8.7 (*)    AST 150 (*)    ALT 57 (*)    Anion gap 18 (*)    All other components within normal limits  CBC WITH DIFFERENTIAL/PLATELET - Abnormal; Notable for the following components:   RBC 4.20 (*)    All other components within normal limits  ETHANOL - Abnormal; Notable for the following components:   Alcohol, Ethyl (B) 40 (*)    All other components within normal limits  URINALYSIS, ROUTINE W REFLEX MICROSCOPIC - Abnormal; Notable for the following components:   Glucose, UA 50 (*)    Hgb urine dipstick SMALL (*)    Ketones, ur 5 (*)    Protein, ur 100 (*)    All other components within normal limits  RAPID URINE DRUG SCREEN,  HOSP PERFORMED - Abnormal; Notable for the following components:   Benzodiazepines POSITIVE (*)    Tetrahydrocannabinol POSITIVE (*)    All other components within normal limits  MAGNESIUM - Abnormal; Notable for the following components:   Magnesium 1.0 (*)    All other components within normal limits    EKG None  Radiology No results found.  Procedures Procedures    Medications Ordered in ED Medications  thiamine (VITAMIN B1) 500 mg in sodium chloride 0.9 % 50 mL IVPB (has no administration in time range)  LORazepam (ATIVAN) tablet 1-4 mg (has no administration in time range)    Or  LORazepam (ATIVAN) injection 1-4 mg (has no administration in time range)  folic acid (FOLVITE) tablet 1 mg (1 mg Oral Given 04/04/23 1504)  multivitamin with minerals tablet 1 tablet (1 tablet Oral Given 04/04/23 1504)  LORazepam (ATIVAN) injection 2 mg (2 mg Intravenous Given 04/04/23 1244)  sodium chloride 0.9 % bolus 1,000 mL (1,000 mLs Intravenous New Bag/Given 04/04/23 1513)  pantoprazole (PROTONIX) injection 40 mg (40 mg Intravenous Given 04/04/23 1244)  magnesium sulfate IVPB 2 g 50 mL (0 g Intravenous Stopped 04/04/23 1611)  potassium chloride SA (KLOR-CON M) CR tablet 40 mEq (40 mEq Oral Given 04/04/23 1504)    ED Course/ Medical Decision Making/ A&P Clinical Course as of 04/04/23 1642  Thu Apr 04, 2023  1538 Received sign out from Dr. Charm Barges pending re-assessment after electrolytes infusion. P/w alcohol withdrawal symptoms. Improved with ativan. Could discharge if no ongoing w/d symptoms.  [WS]    Clinical Course User Index [WS] Lonell Grandchild, MD                                 Medical Decision Making Amount and/or Complexity of Data Reviewed Labs: ordered.  Risk Prescription drug management.   This patient complains of tremors nausea vomiting; this involves an extensive number of treatment Options and is a complaint that carries with it a high risk of complications  and morbidity. The differential includes alcohol withdrawal, intoxication, gastritis, metabolic derangement, dehydration  I ordered, reviewed and interpreted labs, which included CBC normal, chemistries with low potassium low magnesium slightly elevated LFTs, alcohol mildly elevated, urinalysis with small ketones, talk screen positive for benzos THC I ordered medication IV fluids IV magnesium, oral potassium, IV Ativan, PPI and reviewed PMP  when indicated. Additional history obtained from patient's companion Previous records obtained and reviewed in epic, patient seen for alcohol issues back in the fall Cardiac monitoring reviewed, sinus rhythm Social determinants considered, tobacco use Critical Interventions: None  After the interventions stated above, I reevaluated the patient and found patient to be slightly improved with his tremors after benzos Admission and further testing considered, his care is signed out to Dr. Suezanne Jacquet to follow-up on response to fluids and medications.  If he does not have acute withdrawal requiring admission he can likely can be discharged on Librium taper         Final Clinical Impression(s) / ED Diagnoses Final diagnoses:  Alcohol withdrawal syndrome without complication (HCC)  Hypokalemia  Hypomagnesemia    Rx / DC Orders ED Discharge Orders          Ordered    chlordiazePOXIDE (LIBRIUM) 25 MG capsule        04/04/23 1516              Terrilee Files, MD 04/04/23 1645

## 2023-04-04 NOTE — ED Triage Notes (Signed)
 Pt arrived via POV c/o ETOH withdrawal. Pt reports drinking heavily yesterday and just a sip today. Pt presents with active body tremors and emesis in Triage.

## 2023-04-04 NOTE — Discharge Instructions (Addendum)
 I am prescribing you medication to help you get off the alcohol.  You should not use alcohol if you are going to take this medication.  You are also getting resources to reach out to you to get help with your alcohol use.  Return to the emergency department if any worsening or concerning symptoms

## 2023-04-04 NOTE — ED Provider Notes (Signed)
  ED Course / MDM   Clinical Course as of 04/04/23 1729  Thu Apr 04, 2023  1538 Received sign out from Dr. Charm Barges pending re-assessment after electrolytes infusion. P/w alcohol withdrawal symptoms. Improved with ativan. Could discharge if no ongoing w/d symptoms.  [WS]  1728 Patient well-appearing.  He received his electrolyte repletion.  He reports feeling much better after receiving Ativan and denies any tremors.  He is contemplative about stopping alcohol.  Discussed use of Librium with the patient and should not combine this with alcohol if he decides to continue drinking. Will discharge patient to home. All questions answered. Patient comfortable with plan of discharge. Return precautions discussed with patient and specified on the after visit summary.  [WS]    Clinical Course User Index [WS] Lonell Grandchild, MD   Medical Decision Making Amount and/or Complexity of Data Reviewed Labs: ordered.  Risk Prescription drug management.         Lonell Grandchild, MD 04/04/23 [phone]

## 2023-07-04 ENCOUNTER — Encounter (HOSPITAL_COMMUNITY): Payer: Self-pay

## 2023-07-04 ENCOUNTER — Other Ambulatory Visit: Payer: Self-pay

## 2023-07-04 ENCOUNTER — Inpatient Hospital Stay (HOSPITAL_COMMUNITY)
Admission: EM | Admit: 2023-07-04 | Discharge: 2023-07-06 | DRG: 894 | Discharge status: Against Medical Advice | Payer: MEDICAID | Attending: Family Medicine | Admitting: Family Medicine

## 2023-07-04 DIAGNOSIS — Z91148 Patient's other noncompliance with medication regimen for other reason: Secondary | ICD-10-CM

## 2023-07-04 DIAGNOSIS — E875 Hyperkalemia: Secondary | ICD-10-CM | POA: Diagnosis present

## 2023-07-04 DIAGNOSIS — F10239 Alcohol dependence with withdrawal, unspecified: Principal | ICD-10-CM | POA: Diagnosis present

## 2023-07-04 DIAGNOSIS — F17210 Nicotine dependence, cigarettes, uncomplicated: Secondary | ICD-10-CM | POA: Diagnosis present

## 2023-07-04 DIAGNOSIS — R64 Cachexia: Secondary | ICD-10-CM | POA: Diagnosis present

## 2023-07-04 DIAGNOSIS — E876 Hypokalemia: Secondary | ICD-10-CM | POA: Diagnosis present

## 2023-07-04 DIAGNOSIS — Z5321 Procedure and treatment not carried out due to patient leaving prior to being seen by health care provider: Secondary | ICD-10-CM | POA: Diagnosis present

## 2023-07-04 DIAGNOSIS — Z6821 Body mass index (BMI) 21.0-21.9, adult: Secondary | ICD-10-CM

## 2023-07-04 DIAGNOSIS — Z79899 Other long term (current) drug therapy: Secondary | ICD-10-CM

## 2023-07-04 DIAGNOSIS — M6282 Rhabdomyolysis: Secondary | ICD-10-CM | POA: Diagnosis present

## 2023-07-04 DIAGNOSIS — I10 Essential (primary) hypertension: Secondary | ICD-10-CM | POA: Diagnosis present

## 2023-07-04 DIAGNOSIS — F10939 Alcohol use, unspecified with withdrawal, unspecified: Principal | ICD-10-CM

## 2023-07-04 DIAGNOSIS — I160 Hypertensive urgency: Secondary | ICD-10-CM | POA: Diagnosis present

## 2023-07-04 DIAGNOSIS — E871 Hypo-osmolality and hyponatremia: Secondary | ICD-10-CM | POA: Diagnosis present

## 2023-07-04 DIAGNOSIS — Y901 Blood alcohol level of 20-39 mg/100 ml: Secondary | ICD-10-CM | POA: Diagnosis present

## 2023-07-04 LAB — CBC WITH DIFFERENTIAL/PLATELET
Abs Immature Granulocytes: 0.01 10*3/uL (ref 0.00–0.07)
Basophils Absolute: 0 10*3/uL (ref 0.0–0.1)
Basophils Relative: 1 %
Eosinophils Absolute: 0 10*3/uL (ref 0.0–0.5)
Eosinophils Relative: 0 %
HCT: 38.2 % — ABNORMAL LOW (ref 39.0–52.0)
Hemoglobin: 13.4 g/dL (ref 13.0–17.0)
Immature Granulocytes: 0 %
Lymphocytes Relative: 19 %
Lymphs Abs: 0.9 10*3/uL (ref 0.7–4.0)
MCH: 34 pg (ref 26.0–34.0)
MCHC: 35.1 g/dL (ref 30.0–36.0)
MCV: 97 fL (ref 80.0–100.0)
Monocytes Absolute: 0.4 10*3/uL (ref 0.1–1.0)
Monocytes Relative: 9 %
Neutro Abs: 3.2 10*3/uL (ref 1.7–7.7)
Neutrophils Relative %: 71 %
Platelets: 143 10*3/uL — ABNORMAL LOW (ref 150–400)
RBC: 3.94 MIL/uL — ABNORMAL LOW (ref 4.22–5.81)
RDW: 15.2 % (ref 11.5–15.5)
WBC: 4.5 10*3/uL (ref 4.0–10.5)
nRBC: 0 % (ref 0.0–0.2)

## 2023-07-04 LAB — URINALYSIS, ROUTINE W REFLEX MICROSCOPIC
Bacteria, UA: NONE SEEN
Bilirubin Urine: NEGATIVE
Glucose, UA: 150 mg/dL — AB
Ketones, ur: 5 mg/dL — AB
Leukocytes,Ua: NEGATIVE
Nitrite: NEGATIVE
Protein, ur: 100 mg/dL — AB
Specific Gravity, Urine: 1.011 (ref 1.005–1.030)
pH: 7 (ref 5.0–8.0)

## 2023-07-04 LAB — COMPREHENSIVE METABOLIC PANEL WITH GFR
ALT: 44 U/L (ref 0–44)
AST: 112 U/L — ABNORMAL HIGH (ref 15–41)
Albumin: 4.4 g/dL (ref 3.5–5.0)
Alkaline Phosphatase: 59 U/L (ref 38–126)
Anion gap: 18 — ABNORMAL HIGH (ref 5–15)
BUN: 6 mg/dL (ref 6–20)
CO2: 24 mmol/L (ref 22–32)
Calcium: 8.4 mg/dL — ABNORMAL LOW (ref 8.9–10.3)
Chloride: 96 mmol/L — ABNORMAL LOW (ref 98–111)
Creatinine, Ser: 0.52 mg/dL — ABNORMAL LOW (ref 0.61–1.24)
GFR, Estimated: >60 mL/min (ref >60)
Glucose, Bld: 103 mg/dL — ABNORMAL HIGH (ref 70–99)
Potassium: 3.4 mmol/L — ABNORMAL LOW (ref 3.5–5.1)
Sodium: 138 mmol/L (ref 135–145)
Total Bilirubin: 1.2 mg/dL (ref 0.0–1.2)
Total Protein: 8 g/dL (ref 6.5–8.1)

## 2023-07-04 LAB — ETHANOL: Alcohol, Ethyl (B): 25 mg/dL — ABNORMAL HIGH (ref <15)

## 2023-07-04 LAB — RAPID URINE DRUG SCREEN, HOSP PERFORMED
Amphetamines: NOT DETECTED
Barbiturates: NOT DETECTED
Benzodiazepines: NOT DETECTED
Cocaine: NOT DETECTED
Opiates: NOT DETECTED
Tetrahydrocannabinol: NOT DETECTED

## 2023-07-04 LAB — MAGNESIUM: Magnesium: 1 mg/dL — ABNORMAL LOW (ref 1.7–2.4)

## 2023-07-04 LAB — LIPASE, BLOOD: Lipase: 49 U/L (ref 11–51)

## 2023-07-04 LAB — CK: Total CK: 645 U/L — ABNORMAL HIGH (ref 49–397)

## 2023-07-04 MED ORDER — LACTATED RINGERS IV BOLUS
1000.0000 mL | Freq: Once | INTRAVENOUS | Status: AC
  Administered 2023-07-04: 1000 mL via INTRAVENOUS

## 2023-07-04 MED ORDER — ONDANSETRON HCL 4 MG PO TABS: 4.0000 mg | ORAL_TABLET | Freq: Three times a day (TID) | ORAL | Status: DC | PRN

## 2023-07-04 MED ORDER — PANTOPRAZOLE SODIUM 40 MG IV SOLR
40.0000 mg | Freq: Once | INTRAVENOUS | Status: AC
  Administered 2023-07-04: 40 mg via INTRAVENOUS
  Filled 2023-07-04: qty 10

## 2023-07-04 MED ORDER — LACTATED RINGERS IV SOLN: INTRAVENOUS | Status: DC

## 2023-07-04 MED ORDER — THIAMINE MONONITRATE 100 MG PO TABS
100.0000 mg | ORAL_TABLET | Freq: Every day | ORAL | Status: DC
  Administered 2023-07-05 – 2023-07-06 (×2): 100 mg via ORAL
  Filled 2023-07-04 (×2): qty 1

## 2023-07-04 MED ORDER — HYDRALAZINE HCL 20 MG/ML IJ SOLN
5.0000 mg | Freq: Once | INTRAMUSCULAR | Status: AC
  Administered 2023-07-04: 5 mg via INTRAVENOUS
  Filled 2023-07-04: qty 1

## 2023-07-04 MED ORDER — CLONIDINE HCL 0.2 MG PO TABS
0.2000 mg | ORAL_TABLET | Freq: Two times a day (BID) | ORAL | Status: DC
  Administered 2023-07-04: 0.2 mg via ORAL
  Filled 2023-07-04: qty 1

## 2023-07-04 MED ORDER — FOLIC ACID 1 MG PO TABS
1.0000 mg | ORAL_TABLET | Freq: Once | ORAL | Status: AC
  Administered 2023-07-04: 1 mg via ORAL
  Filled 2023-07-04: qty 1

## 2023-07-04 MED ORDER — LORAZEPAM 2 MG/ML IJ SOLN
1.0000 mg | INTRAMUSCULAR | Status: DC | PRN
  Administered 2023-07-05: 1 mg via INTRAVENOUS
  Administered 2023-07-06: 2 mg via INTRAVENOUS
  Filled 2023-07-04 (×2): qty 1

## 2023-07-04 MED ORDER — MAGNESIUM SULFATE 2 GM/50ML IV SOLN
2.0000 g | Freq: Once | INTRAVENOUS | Status: AC
  Administered 2023-07-04: 2 g via INTRAVENOUS
  Filled 2023-07-04: qty 50

## 2023-07-04 MED ORDER — HYDRALAZINE HCL 20 MG/ML IJ SOLN
5.0000 mg | Freq: Once | INTRAMUSCULAR | Status: AC
Start: 2023-07-04 — End: 2023-07-04
  Administered 2023-07-04: 5 mg via INTRAVENOUS
  Filled 2023-07-04: qty 1

## 2023-07-04 MED ORDER — LORAZEPAM 2 MG/ML IJ SOLN
0.0000 mg | Freq: Four times a day (QID) | INTRAMUSCULAR | Status: DC
  Administered 2023-07-04: 2 mg via INTRAVENOUS
  Filled 2023-07-04: qty 1

## 2023-07-04 MED ORDER — METOPROLOL TARTRATE 25 MG PO TABS: 25.0000 mg | ORAL_TABLET | Freq: Two times a day (BID) | ORAL | Status: DC

## 2023-07-04 MED ORDER — HYDRALAZINE HCL 20 MG/ML IJ SOLN
10.0000 mg | INTRAMUSCULAR | Status: DC | PRN
  Administered 2023-07-04 – 2023-07-06 (×3): 10 mg via INTRAVENOUS
  Filled 2023-07-04 (×3): qty 1

## 2023-07-04 MED ORDER — POTASSIUM CHLORIDE CRYS ER 20 MEQ PO TBCR
40.0000 meq | EXTENDED_RELEASE_TABLET | Freq: Four times a day (QID) | ORAL | Status: AC
  Administered 2023-07-04 – 2023-07-05 (×2): 40 meq via ORAL
  Filled 2023-07-04 (×2): qty 2

## 2023-07-04 MED ORDER — LORAZEPAM 1 MG PO TABS
1.0000 mg | ORAL_TABLET | ORAL | Status: DC | PRN
  Administered 2023-07-04 – 2023-07-05 (×3): 1 mg via ORAL
  Administered 2023-07-06 (×3): 2 mg via ORAL
  Filled 2023-07-04 (×3): qty 1
  Filled 2023-07-04 (×3): qty 2

## 2023-07-04 MED ORDER — THIAMINE HCL 100 MG/ML IJ SOLN
INTRAMUSCULAR | Status: AC
  Filled 2023-07-04: qty 6

## 2023-07-04 MED ORDER — CHLORHEXIDINE GLUCONATE CLOTH 2 % EX PADS
6.0000 | MEDICATED_PAD | Freq: Every day | CUTANEOUS | Status: DC
  Administered 2023-07-04: 6 via TOPICAL

## 2023-07-04 MED ORDER — LORAZEPAM 2 MG/ML IJ SOLN
2.0000 mg | Freq: Once | INTRAMUSCULAR | Status: AC
  Administered 2023-07-04: 2 mg via INTRAVENOUS
  Filled 2023-07-04: qty 1

## 2023-07-04 MED ORDER — THIAMINE HCL 100 MG/ML IJ SOLN
500.0000 mg | Freq: Once | INTRAVENOUS | Status: AC
  Administered 2023-07-04: 500 mg via INTRAVENOUS
  Filled 2023-07-04: qty 5

## 2023-07-04 MED ORDER — THIAMINE HCL 100 MG/ML IJ SOLN
100.0000 mg | Freq: Every day | INTRAMUSCULAR | Status: DC
  Administered 2023-07-04: 100 mg via INTRAVENOUS
  Filled 2023-07-04: qty 2

## 2023-07-04 MED ORDER — LORAZEPAM 1 MG PO TABS: 0.0000 mg | ORAL_TABLET | Freq: Four times a day (QID) | ORAL | Status: DC

## 2023-07-04 NOTE — ED Triage Notes (Signed)
 Pt arrived via POV c/o emesis and body tremors. Pt reports last ETOH consumption was yesterday. Pt concerned about withdrawals.

## 2023-07-04 NOTE — H&P (Signed)
 [facility] H&P   Patient Demographics:    Carlos Good, is a 38 y.o. male  MRN: [id number]   DOB - 1985/09/14  Admit Date - 07/04/2023  Outpatient Primary MD for the patient is Pcp, No  Referring MD/NP/PA: PA Tammy  Patient coming from: home  Chief Complaint  Patient presents with   Emesis      HPI:    Carlos Good  is a 38 y.o. male, with past medical history of alcohol abuse, hypertension, noncompliance with medication, he came to ED due to complaints of tremors, nausea and vomiting, patient with heavy alcohol abuse, drinking a pint of liquor daily, most recently was yesterday, this morning he was at work, mowing in the hot sun all day, he began to have shakes, nausea, vomiting, as well loose stools, he denies abdominal pain, chest pain, fever or chills, patient had similar symptoms in April where he was in alcohol withdrawals. - In ED patient was noted to be significantly hypertensive, and alcohol withdrawals with significant tremors, labs were significant for hypomagnesemia, hypokalemia, total CK was elevated at 650, so [facility] hospitalist consulted to admit.    Review of systems:      A full 10 point Review of Systems was done, except as stated above, all other Review of Systems were negative.   With Past History of the following :    Past Medical History:  Diagnosis Date   ETOH abuse    Hypertension       History reviewed. No pertinent surgical history.    Social History:     Social History   Tobacco Use   Smoking status: Every Day    Current packs/day: 0.50    Types: Cigarettes   Smokeless tobacco: Never  Substance Use Topics   Alcohol use: Yes    Alcohol/week: 7.0 standard drinks of alcohol    Types: 7 Cans of beer per week    Comment: occ        Family History :    History reviewed. No pertinent family history.    Home Medications:   Prior to  Admission medications   Medication Sig Start Date End Date Taking? Authorizing Provider  amLODipine  (NORVASC ) 2.5 MG tablet Take 2.5 mg by mouth daily.    [provider]     Allergies:    No Known Allergies   Physical Exam:   Vitals  Blood pressure (!) 204/122, pulse 98, temperature 98.7 F (37.1 C), temperature source Oral, resp. rate 16, height 6' 3 (1.905 m), weight 73 kg, SpO2 100%.   1. General Thin appearing male, laying in bed, restless, anxious, with significant tremors  2. Normal affect and insight, Not Suicidal or Homicidal, Awake Alert, Oriented X 3.  3. No F.N deficits, ALL C.Nerves Intact, Strength 5/5 all 4 extremities, Sensation intact all 4 extremities, Plantars down going.  4. Ears and Eyes appear Normal,  Conjunctivae clear, PERRLA. Moist Oral Mucosa.  5. Supple Neck, No JVD, No cervical lymphadenopathy appriciated, No Carotid Bruits.  6. Symmetrical Chest wall movement, Good air movement bilaterally, CTAB.  7.  Tachycardic, No Gallops, Rubs or Murmurs, No Parasternal Heave.  8. Positive Bowel Sounds, Abdomen Soft, No tenderness, No organomegaly appriciated,No rebound -guarding or rigidity.  9.  No Cyanosis, Normal Skin Turgor, No Skin Rash or Bruise.  10. Good muscle tone,  joints appear normal , no effusions, Normal ROM.     Data Review:    CBC Recent Labs  Lab 07/04/23 1330  WBC 4.5  HGB 13.4  HCT 38.2*  PLT 143*  MCV 97.0  MCH 34.0  MCHC 35.1  RDW 15.2  LYMPHSABS 0.9  MONOABS 0.4  EOSABS 0.0  BASOSABS 0.0   ------------------------------------------------------------------------------------------------------------------  Chemistries  Recent Labs  Lab 07/04/23 1330  NA 138  K 3.4*  CL 96*  CO2 24  GLUCOSE 103*  BUN 6  CREATININE 0.52*  CALCIUM 8.4*  MG 1.0*  AST 112*  ALT 44  ALKPHOS 59  BILITOT 1.2    ------------------------------------------------------------------------------------------------------------------ estimated creatinine clearance is 130.5 mL/min (A) (by C-G formula based on SCr of 0.52 mg/dL (L)). ------------------------------------------------------------------------------------------------------------------ No results for input(s): TSH, T4TOTAL, T3FREE, THYROIDAB in the last 72 hours.  Invalid input(s): FREET3  Coagulation profile No results for input(s): INR, PROTIME in the last 168 hours. ------------------------------------------------------------------------------------------------------------------- No results for input(s): DDIMER in the last 72 hours. -------------------------------------------------------------------------------------------------------------------  Cardiac Enzymes No results for input(s): CKMB, TROPONINI, MYOGLOBIN in the last 168 hours.  Invalid input(s): CK ------------------------------------------------------------------------------------------------------------------ No results found for: BNP   ---------------------------------------------------------------------------------------------------------------  Urinalysis    Component Value Date/Time   COLORURINE STRAW (A) 07/04/2023 1540   APPEARANCEUR CLEAR 07/04/2023 1540   LABSPEC 1.011 07/04/2023 1540   PHURINE 7.0 07/04/2023 1540   GLUCOSEU 150 (A) 07/04/2023 1540   HGBUR SMALL (A) 07/04/2023 1540   BILIRUBINUR NEGATIVE 07/04/2023 1540   BILIRUBINUR small (A) 11/18/2021 1618   KETONESUR 5 (A) 07/04/2023 1540   PROTEINUR 100 (A) 07/04/2023 1540   UROBILINOGEN 1.0 11/18/2021 1618   NITRITE NEGATIVE 07/04/2023 1540   LEUKOCYTESUR NEGATIVE 07/04/2023 1540    ----------------------------------------------------------------------------------------------------------------   Imaging Results:    No results found.     Assessment & Plan:     Principal Problem:   Alcohol withdrawal (HCC) Active Problems:   Uncontrolled hypertension    Alcohol abuse with withdrawals - Patient with history of heavy alcohol use, who presents with tremors, withdrawals, hypertensive and restless - He was counseled. - Started on CIWA protocol, stepdown protocol - Continue thiamine  and folic acid   Rhabdomyolysis - CK elevated at 650, reports nausea, vomiting, as working on prolonged hours under the sun today - Continue with IV fluids - Check CK in a.m  Transaminitis - AST is elevated at 112, this is due to alcohol abuse  Hypertensive urgency - Patient with known history of blood pressure, does appear he is only on low-dose Norvasc , but has been noncompliant-blood pressure significantly elevated, systolic 209/195, this is most likely as well in the setting of alcohol withdrawals, he was given as needed hydralazine, and started on clonidine which should help with both blood pressure and withdrawals  Hyperkalemia -replaced   hypomagnesemia -replaced  DVT Prophylaxis SCDs , consider subcu Lovenox in 24 hours if he is nonambulatory  AM Labs Ordered, also please review Full Orders  Family Communication: Admission, patients condition and plan of care including tests being ordered have been  discussed with the patient and sister at bedside who indicate understanding and agree with the plan and Code Status.  Code Status full code  Likely DC to home  Consults called: None  Admission status: Inpatient  Time spent in minutes : 75 minutes   Brayton Lye M.D on 07/04/2023 at 8:50 PM   [facility] - Office  [phone]

## 2023-07-04 NOTE — ED Provider Notes (Signed)
 [place] EMERGENCY DEPARTMENT AT [facility] Provider Note   CSN: [id number] Arrival date & time: 07/04/23  1300     Patient presents with: Emesis   Carlos Good is a 38 y.o. male.    Emesis Associated symptoms: diarrhea   Associated symptoms: no abdominal pain, no chills, no fever and no headaches        Carlos Good is a 38 y.o. male past medical history of hypertension and alcohol use who presents to the Emergency Department for evaluation of tremors and nausea vomiting.  States that he last drank a pint of liquor last evening.  Woke this morning has been out in the hot sun mowing the lawns began having shakes and vomiting.  Endorses some loose stools as well.  Denies any abdominal pain chest pain or shortness of breath.  States he typically drinks a pint of liquor per day.  Was here in April for similar symptoms. Family member at bedside states that he has also been off of his antihypertensive medications for some time.  Does not currently have PCP.  Patient believes he was taking amlodipine  previously.  Prior to Admission medications   Medication Sig Start Date End Date Taking? Authorizing Provider  amLODipine  (NORVASC ) 2.5 MG tablet Take 2.5 mg by mouth daily.    [provider]    Allergies: Patient has no known allergies.    Review of Systems  Constitutional:  Negative for chills and fever.  Respiratory:  Negative for shortness of breath.   Cardiovascular:  Negative for chest pain.  Gastrointestinal:  Positive for diarrhea, nausea and vomiting. Negative for abdominal pain and blood in stool.  Musculoskeletal:  Negative for back pain.  Neurological:  Positive for tremors. Negative for dizziness and headaches.    Updated Vital Signs BP (!) 192/113   Pulse 93   Temp 98.2 F (36.8 C)   Resp 18   Ht 6' 3 (1.905 m)   Wt 77.1 kg   SpO2 98%   BMI 21.25 kg/m   Physical Exam Vitals and nursing note reviewed.  Constitutional:      Comments:  Pt tremulous on exam  HENT:     Mouth/Throat:     Mouth: Mucous membranes are dry.  Eyes:     Conjunctiva/sclera: Conjunctivae normal.     Pupils: Pupils are equal, round, and reactive to light.  Cardiovascular:     Rate and Rhythm: Regular rhythm. Tachycardia present.     Pulses: Normal pulses.  Pulmonary:     Effort: Pulmonary effort is normal.     Breath sounds: Normal breath sounds.  Abdominal:     General: There is no distension.     Palpations: Abdomen is soft.     Tenderness: There is no abdominal tenderness. There is no guarding.  Musculoskeletal:        General: Normal range of motion.  Skin:    General: Skin is warm.  Neurological:     General: No focal deficit present.     Mental Status: He is alert.     (all labs ordered are listed, but only abnormal results are displayed) Labs Reviewed  CBC WITH DIFFERENTIAL/PLATELET - Abnormal; Notable for the following components:      Result Value   RBC 3.94 (*)    HCT 38.2 (*)    Platelets 143 (*)    All other components within normal limits  ETHANOL - Abnormal; Notable for the following components:   Alcohol, Ethyl (B)  25 (*)    All other components within normal limits  COMPREHENSIVE METABOLIC PANEL WITH GFR - Abnormal; Notable for the following components:   Potassium 3.4 (*)    Chloride 96 (*)    Glucose, Bld 103 (*)    Creatinine, Ser 0.52 (*)    Calcium 8.4 (*)    AST 112 (*)    Anion gap 18 (*)    All other components within normal limits  URINALYSIS, ROUTINE W REFLEX MICROSCOPIC - Abnormal; Notable for the following components:   Color, Urine STRAW (*)    Glucose, UA 150 (*)    Hgb urine dipstick SMALL (*)    Ketones, ur 5 (*)    Protein, ur 100 (*)    All other components within normal limits  MAGNESIUM  - Abnormal; Notable for the following components:   Magnesium  1.0 (*)    All other components within normal limits  LIPASE, BLOOD  RAPID URINE DRUG SCREEN, HOSP PERFORMED  CK     EKG: None  Radiology: No results found.   Procedures   Medications Ordered in the ED  LORazepam  (ATIVAN ) injection 0-4 mg (2 mg Intravenous Given 07/04/23 1424)    Or  LORazepam  (ATIVAN ) tablet 0-4 mg ( Oral See Alternative 07/04/23 1424)  thiamine  (VITAMIN B1) tablet 100 mg ( Oral See Alternative 07/04/23 1425)    Or  thiamine  (VITAMIN B1) injection 100 mg (100 mg Intravenous Given 07/04/23 1425)  ondansetron  (ZOFRAN ) tablet 4 mg (has no administration in time range)  lactated ringers  bolus 1,000 mL (0 mLs Intravenous Stopped 07/04/23 1506)  pantoprazole  (PROTONIX ) injection 40 mg (40 mg Intravenous Given 07/04/23 1424)  folic acid  (FOLVITE ) tablet 1 mg (1 mg Oral Given 07/04/23 1425)  magnesium  sulfate IVPB 2 g 50 mL (0 g Intravenous Stopped 07/04/23 1642)  LORazepam  (ATIVAN ) injection 2 mg (2 mg Intravenous Given 07/04/23 1537)  hydrALAZINE  (APRESOLINE ) injection 5 mg (5 mg Intravenous Given 07/04/23 1656)                                    Medical Decision Making Patient here with history of alcohol use.  Drinks approximately 1 pint liquor daily.  Last drink alcohol last evening.  Working outside in the hot sun today sweating began having shakes and vomiting.  He is hypertensive here has been off of his medication for some time.  Denies any chest pain shortness of breath abdominal pain   Initial CIWA 11  Suspect acute alcohol withdrawal.  Patient is hypertensive as well.  Will give fluids Ativan , folic acid  thiamine  check magnesium  level as well.  Anticipate hospital admission  Amount and/or Complexity of Data Reviewed Labs: ordered.    Details: No leukocytosis, blood alcohol 25, chemistries without acute derangement.  Magnesium  low at 1.0.  His total CK slightly elevated 645.  UDS negative urinalysis without evidence of infection Discussion of management or test interpretation with external provider(s): Patient has been given Ativan  per alcohol withdrawal protocol.  No longer tremulous.   No vomiting here.  States he feels better.  CK was elevated but patient has had 2 L of IV fluids.  Despite treatment for his alcohol withdrawal he remains hypertensive.  Given hydralazine  here without significant improvement of his blood pressure.  Patient is agreeable to hospital admission  Repeat CIWA now 4.  Pt resting comfortably.    Discussed findings with [facility] hospitalist, Dr. Sherlon who agrees  to admit  Risk OTC drugs. Prescription drug management. Decision regarding hospitalization.        Final diagnoses:  Alcohol withdrawal syndrome with complication [facility])  Hypertensive urgency    ED Discharge Orders     None          Herlinda Milling, PA-C 07/05/23 0016    Dean Clarity, MD 07/05/23 [phone]

## 2023-07-04 NOTE — ED Notes (Signed)
AC called to bring pt's medication from the main pharmacy

## 2023-07-04 NOTE — ED Notes (Signed)
 Pt placed on cardiac monitoring.

## 2023-07-04 NOTE — ED Notes (Signed)
 Pt assisted to the bathroom by this RN and pt's sister.

## 2023-07-05 DIAGNOSIS — F10939 Alcohol use, unspecified with withdrawal, unspecified: Secondary | ICD-10-CM

## 2023-07-05 DIAGNOSIS — I10 Essential (primary) hypertension: Secondary | ICD-10-CM

## 2023-07-05 LAB — BASIC METABOLIC PANEL WITH GFR
Anion gap: 10 (ref 5–15)
BUN: <5 mg/dL — ABNORMAL LOW (ref 6–20)
CO2: 25 mmol/L (ref 22–32)
Calcium: 7.2 mg/dL — ABNORMAL LOW (ref 8.9–10.3)
Chloride: 97 mmol/L — ABNORMAL LOW (ref 98–111)
Creatinine, Ser: 0.42 mg/dL — ABNORMAL LOW (ref 0.61–1.24)
GFR, Estimated: >60 mL/min (ref >60)
Glucose, Bld: 89 mg/dL (ref 70–99)
Potassium: 2.7 mmol/L — CL (ref 3.5–5.1)
Sodium: 132 mmol/L — ABNORMAL LOW (ref 135–145)

## 2023-07-05 LAB — CBC
HCT: 35.2 % — ABNORMAL LOW (ref 39.0–52.0)
Hemoglobin: 12 g/dL — ABNORMAL LOW (ref 13.0–17.0)
MCH: 32.8 pg (ref 26.0–34.0)
MCHC: 34.1 g/dL (ref 30.0–36.0)
MCV: 96.2 fL (ref 80.0–100.0)
Platelets: 117 K/uL — ABNORMAL LOW (ref 150–400)
RBC: 3.66 MIL/uL — ABNORMAL LOW (ref 4.22–5.81)
RDW: 14.5 % (ref 11.5–15.5)
WBC: 4.8 K/uL (ref 4.0–10.5)
nRBC: 0 % (ref 0.0–0.2)

## 2023-07-05 LAB — MRSA NEXT GEN BY PCR, NASAL: MRSA by PCR Next Gen: NOT DETECTED

## 2023-07-05 LAB — HIV ANTIBODY (ROUTINE TESTING W REFLEX): HIV Screen 4th Generation wRfx: NONREACTIVE

## 2023-07-05 LAB — CK: Total CK: 438 U/L — ABNORMAL HIGH (ref 49–397)

## 2023-07-05 LAB — PHOSPHORUS: Phosphorus: 2.4 mg/dL — ABNORMAL LOW (ref 2.5–4.6)

## 2023-07-05 LAB — MAGNESIUM: Magnesium: 1.4 mg/dL — ABNORMAL LOW (ref 1.7–2.4)

## 2023-07-05 MED ORDER — CHLORDIAZEPOXIDE HCL 5 MG PO CAPS
25.0000 mg | ORAL_CAPSULE | Freq: Three times a day (TID) | ORAL | Status: DC
  Administered 2023-07-05 – 2023-07-06 (×5): 25 mg via ORAL
  Filled 2023-07-05 (×2): qty 5
  Filled 2023-07-05: qty 1
  Filled 2023-07-05 (×2): qty 5

## 2023-07-05 MED ORDER — POTASSIUM CHLORIDE 20 MEQ PO PACK
40.0000 meq | PACK | Freq: Once | ORAL | Status: AC
  Administered 2023-07-05: 40 meq via ORAL
  Filled 2023-07-05: qty 2

## 2023-07-05 MED ORDER — ADULT MULTIVITAMIN W/MINERALS CH
1.0000 | ORAL_TABLET | Freq: Every day | ORAL | Status: DC
  Administered 2023-07-06: 1 via ORAL
  Filled 2023-07-05: qty 1

## 2023-07-05 MED ORDER — CHLORDIAZEPOXIDE HCL 5 MG PO CAPS: 10.0000 mg | ORAL_CAPSULE | Freq: Three times a day (TID) | ORAL | Status: DC

## 2023-07-05 MED ORDER — LACTATED RINGERS IV SOLN: INTRAVENOUS | Status: DC

## 2023-07-05 MED ORDER — AMLODIPINE BESYLATE 5 MG PO TABS
5.0000 mg | ORAL_TABLET | Freq: Every day | ORAL | Status: DC
  Administered 2023-07-05 – 2023-07-06 (×2): 5 mg via ORAL
  Filled 2023-07-05 (×2): qty 1

## 2023-07-05 MED ORDER — HYDRALAZINE HCL 25 MG PO TABS
25.0000 mg | ORAL_TABLET | Freq: Three times a day (TID) | ORAL | Status: DC
  Administered 2023-07-05 – 2023-07-06 (×4): 25 mg via ORAL
  Filled 2023-07-05 (×5): qty 1

## 2023-07-05 MED ORDER — CHLORDIAZEPOXIDE HCL 5 MG PO CAPS: 5.0000 mg | ORAL_CAPSULE | Freq: Three times a day (TID) | ORAL | Status: DC

## 2023-07-05 MED ORDER — MAGNESIUM SULFATE 4 GM/100ML IV SOLN
4.0000 g | Freq: Once | INTRAVENOUS | Status: AC
  Administered 2023-07-05: 4 g via INTRAVENOUS
  Filled 2023-07-05: qty 100

## 2023-07-05 MED ORDER — HYDRALAZINE HCL 20 MG/ML IJ SOLN
10.0000 mg | Freq: Once | INTRAMUSCULAR | Status: AC
  Administered 2023-07-05: 10 mg via INTRAVENOUS
  Filled 2023-07-05: qty 1

## 2023-07-05 NOTE — Progress Notes (Signed)
 PROGRESS NOTE   Carlos Good  FMW:[id number] DOB: 05-Feb-1985 DOA: 07/04/2023 PCP: Pcp, No   Chief Complaint  Patient presents with   Emesis   Level of care: Telemetry  Brief Admission History:  38 y.o. male with past medical history of alcohol abuse, hypertension, noncompliance with medication, he came to ED due to complaints of tremors, nausea and vomiting, patient with heavy alcohol abuse, drinking a pint of liquor daily, most recently was yesterday, this morning he was at work, mowing in the hot sun all day, he began to have shakes, nausea, vomiting, as well loose stools, he denies abdominal pain, chest pain, fever or chills, patient had similar symptoms in April where he was in alcohol withdrawals.  In ED patient was noted to be significantly hypertensive, and alcohol withdrawals with significant tremors, labs were significant for hypomagnesemia, hypokalemia, total CK was elevated at 650, so [facility] hospitalist consulted to admit.   Assessment and Plan:  Alcohol abuse with withdrawals - Patient with history of heavy alcohol use, who presents with tremors, withdrawals, hypertensive and restless - He was counseled at bedside to stop abusing alcohol. - Started on CIWA protocol, started on scheduled librium  taper;  - Continue thiamine  and folic acid  and MVI   Rhabdomyolysis - CK elevated at 650, reports nausea, vomiting, as working on prolonged hours under the sun today - Continue with IV fluids - CK trending down, being monitored    Transaminitis - AST is elevated at 112   Hypertensive urgency - Patient with known history of blood pressure, does appear he is only on low-dose Norvasc , but has been noncompliant-blood pressure significantly elevated, systolic 209/195, this is most likely as well in the setting of alcohol withdrawals, - restarted amlodipine  5 mg, hydralazine  25 mg TID - monitor BP   Hyponatremia -continue IV fluid hydration -recheck labs in AM   Hypokalemia -this  is being repleted    hypomagnesemia -this is being repleted   Hypophosphatemia - mild; encourage diet today - recheck in AM and if remains low replete further  DVT prophylaxis: SCDs Code Status: Full  Family Communication: bedside update 7/4  Disposition: home    Consultants:   Procedures:   Antimicrobials:    Subjective: Pt reports he is having not withdrawal symptoms at this time; he does not have an appetite   Objective: Vitals:   07/05/23 0630 07/05/23 0700 07/05/23 0751 07/05/23 0800  BP: (!) 181/105 (!) 154/99 (!) 143/92 (!) 155/98  Pulse: 96 94  (!) 101  Resp: 19 15  19   Temp:      TempSrc:      SpO2: 99% 98%  99%  Weight:      Height:        Intake/Output Summary (Last 24 hours) at 07/05/2023 1000 Last data filed at 07/05/2023 0800 Gross per 24 hour  Intake 2129.75 ml  Output 1700 ml  Net 429.75 ml   Filed Weights   07/04/23 1308 07/04/23 2005  Weight: 77.1 kg 73 kg   Examination:  General exam: cachectic, emaciated male, Appears calm and comfortable  Respiratory system: Clear to auscultation. Respiratory effort normal. Cardiovascular system: normal S1 & S2 heard. No JVD, murmurs, rubs, gallops or clicks. No pedal edema. Gastrointestinal system: Abdomen is nondistended, soft and nontender. No organomegaly or masses felt. Normal bowel sounds heard. Central nervous system: Alert and oriented. No focal neurological deficits. Extremities: Symmetric 5 x 5 power. Skin: No rashes, lesions or ulcers. Psychiatry: Judgement and insight appear normal. Mood &  affect appropriate.   Data Reviewed: I have personally reviewed following labs and imaging studies  CBC: Recent Labs  Lab 07/04/23 1330 07/05/23 0415  WBC 4.5 4.8  NEUTROABS 3.2  --   HGB 13.4 12.0*  HCT 38.2* 35.2*  MCV 97.0 96.2  PLT 143* 117*    Basic Metabolic Panel: Recent Labs  Lab 07/04/23 1330 07/05/23 0415  NA 138 132*  K 3.4* 2.7*  CL 96* 97*  CO2 24 25  GLUCOSE 103* 89  BUN 6  <5*  CREATININE 0.52* 0.42*  CALCIUM 8.4* 7.2*  MG 1.0* 1.4*  PHOS  --  2.4*    CBG: No results for input(s): GLUCAP in the last 168 hours.  Recent Results (from the past 240 hours)  MRSA Next Gen by PCR, Nasal     Status: None   Collection Time: 07/04/23  7:38 PM   Specimen: Nasal Mucosa; Nasal Swab  Result Value Ref Range Status   MRSA by PCR Next Gen NOT DETECTED NOT DETECTED Final    Comment: (NOTE) The GeneXpert MRSA Assay (FDA approved for NASAL specimens only), is one component of a comprehensive MRSA colonization surveillance program. It is not intended to diagnose MRSA infection nor to guide or monitor treatment for MRSA infections. Test performance is not FDA approved in patients less than 25 years old. Performed at [facility], [address] [place], [place] [address]      Radiology Studies: No results found.  Scheduled Meds:  amLODipine   5 mg Oral Daily   chlordiazePOXIDE   25 mg Oral TID   Followed by   NOREEN ON 07/07/2023] chlordiazePOXIDE   10 mg Oral TID   Followed by   NOREEN ON 07/09/2023] chlordiazePOXIDE   5 mg Oral TID   Chlorhexidine  Gluconate Cloth  6 each Topical Q0600   hydrALAZINE   25 mg Oral Q8H   thiamine   100 mg Oral Daily   Or   thiamine   100 mg Intravenous Daily   Continuous Infusions:  lactated ringers  125 mL/hr at 07/05/23 0744     LOS: 1 day   Time spent: 56 mins  Jaden Batchelder Vicci, MD How to contact the [facility] Attending or Consulting provider 7A - 7P or covering provider during after hours 7P -7A, for this patient?  Check the care team in [facility] and look for a) attending/consulting [facility] provider listed and b) the TR[facility] team listed Log into [email or web address] to find provider on call.  Locate the [facility] provider you are looking for under [facility] and page to a number that you can be directly reached. If you still have difficulty reaching the provider, please page the [facility] (Director on Call) for the Hospitalists listed on amion for  assistance.  07/05/2023, 10:00 AM

## 2023-07-05 NOTE — Progress Notes (Signed)
 Pt arrived to room 312 via WC from ICU. A&O, denies c/o at present. Obvious tremors of bilateral hands. IVF infusing without s/s infiltration. Oriented to room and safety procedures, pt states understanding. Call bell within reach.

## 2023-07-05 NOTE — Progress Notes (Signed)
   07/05/23 1953  Vitals  Temp 99.3 F (37.4 C)  Temp Source Oral  BP (!) 155/111  MAP (mmHg) 124  BP Location Left Arm  BP Method Automatic  Patient Position (if appropriate) Sitting  Pulse Rate 87  Pulse Rate Source Monitor  Resp 20  Level of Consciousness  Level of Consciousness Alert  MEWS COLOR  MEWS Score Color Green  Oxygen Therapy  SpO2 100 %  O2 Device Room Air  Pain Assessment  Pain Scale 0-10  Pain Score 0  MEWS Score  MEWS Temp 0  MEWS Systolic 0  MEWS Pulse 0  MEWS RR 0  MEWS LOC 0  MEWS Score 0

## 2023-07-05 NOTE — Progress Notes (Signed)
 Critical lab result documentation   07/05/23 0558  Provider Notification  Provider Name/Title Dr. [place] Peaches  Date Provider Notified 07/05/23  Time Provider Notified 0559  Method of Notification Page  Notification Reason Critical Result  Test performed and critical result K+ 2.7  Date Critical Result Received 07/05/23  Time Critical Result Received 0556  Provider response En route

## 2023-07-05 NOTE — Progress Notes (Signed)
   07/05/23 1436  TOC Brief Assessment  Insurance and Status Lapsed  Patient has primary care physician No  Home environment has been reviewed From home  Prior level of function: Independent  Prior/Current Home Services No current home services  Social Drivers of Health Review SDOH reviewed interventions complete  Readmission risk has been reviewed Yes   Pt provided c/information cards on [facility] a program for residents of [place] that offers primary care, specialty care, dental, case mgmt, pharmacy services and medication assistance programs and [facility]. Pt also given an information packet on [facility] outpatient rehab services and a schedule of local AA meetings. AVS updated.

## 2023-07-05 NOTE — Discharge Instructions (Addendum)
 [facility] - [phone] [facility] is a program for uninsured residents of [facility] that provides primary care, specialty care, dental, nurse case mgmt, pharmacy services and medication assistance.  [facility] [place] [address] [place], [place] [address] [phone]  [facility] [address] [place], [place] [address] [phone] Call to see if you qualify for Medicaid, a joint federal and state program that provides health coverage to people with limited income and resources.  [facility] [employer] (outpatient rehab clinic) [phone] [address] [place], [place] [address]  [place] AA Meetings  Monday  Blue [employer] Special --12:00 noon  Open Discussion  [employer] - [employer]  [address] [place]   Fellowship Group - 8:00 pm  Open Speaker  [employer]  [address] [place]   Tuesday  [employer] Special --12:00 noon  Open Discussion [employer]  [address] [place]   Fellowship Group - 8:00 pm  Big Book Quotes Cards Open Discussion  First [address]  [address] [place]   Wednesday  [employer] Special --12:00 noon  Open Discussion  [facility] [place] [employer]  [address] [place] Group - 8:00 pm  Big Book Study  [employer]  [address] [place]   Thursday [employer] Special --12:00 noon  Open Discussion  [facility] [employer]  [address] [employer] Noon Meeting - 12:00 noon  Open Discussion  [employer]  [address] - [place]  Thursday [place] Group - 7:00 pm  Closed Discussion  [address] [facility] [place] [address] [place] [place]   Fellowship Group - 8:00 pm  [employer]  [address] - [place]   Friday  Blue [employer] Special --12:00 noon  Open Discussion  [place] - [employer]  [address] [place]   Take a Load Off - 7:00 pm  Women's Meeting  [employer]  [address] [place]   Fellowship Group - 8:00 pm  Open Discussion  [employer]  [address] - [place]   Saturday  12 Changes Group - 9:00 am  Open Discussion  [employer]  [address] - [place]   Happy Destiny Group - 6:00 pm  Open Discussion  [facility]  [address] [place]   Fellowship Group - 8:00 pm  Open Discussion  First [employer] [address] - [place]  Sunday  Fellowship Group - 8:00 pm  Open Discussion/12 & 12 Study  First [address]  [address] - [place]   Al -Anon  Thursday  [place] Group - 7:00 pm  [address] [facility] [place]  [address] - [place]   ACA - Adult Children of Alcoholics  Saturday  Pains of Growing Up - 6:00pm  [employer]  [address] - [place]   AA Meetings in Surrounding Areas  Tuesday - 6:30 pm  Open Topic Discussion  [employer]  [address] - [place]   Wednesday - 8:00 pm  Open Literature  [facility]  [address] [place]   Thursday --6:30 pm  Open Big Book  [employer]  [phone] [address] --  [place]   Sunday - 6:30 pm  Open Topic Discussion  [employer]  [address] - [place]

## 2023-07-05 NOTE — Hospital Course (Addendum)
 38 y.o. male with past medical history of alcohol abuse, hypertension, noncompliance with medication, he came to ED due to complaints of tremors, nausea and vomiting, patient with heavy alcohol abuse, drinking a pint of liquor daily, most recently was yesterday, this morning he was at work, mowing in the hot sun all day, he began to have shakes, nausea, vomiting, as well loose stools, he denies abdominal pain, chest pain, fever or chills, patient had similar symptoms in April where he was in alcohol withdrawals.  In ED patient was noted to be significantly hypertensive, and alcohol withdrawals with significant tremors, labs were significant for hypomagnesemia, hypokalemia, total CK was elevated at 650, so [facility] hospitalist consulted to admit.

## 2023-07-06 LAB — BASIC METABOLIC PANEL WITH GFR
Anion gap: 9 (ref 5–15)
BUN: <5 mg/dL — ABNORMAL LOW (ref 6–20)
CO2: 22 mmol/L (ref 22–32)
Calcium: 7.9 mg/dL — ABNORMAL LOW (ref 8.9–10.3)
Chloride: 101 mmol/L (ref 98–111)
Creatinine, Ser: 0.49 mg/dL — ABNORMAL LOW (ref 0.61–1.24)
GFR, Estimated: >60 mL/min (ref >60)
Glucose, Bld: 88 mg/dL (ref 70–99)
Potassium: 3.2 mmol/L — ABNORMAL LOW (ref 3.5–5.1)
Sodium: 132 mmol/L — ABNORMAL LOW (ref 135–145)

## 2023-07-06 LAB — CK: Total CK: 361 U/L (ref 49–397)

## 2023-07-06 LAB — PHOSPHORUS: Phosphorus: 2.1 mg/dL — ABNORMAL LOW (ref 2.5–4.6)

## 2023-07-06 LAB — MAGNESIUM: Magnesium: 1.5 mg/dL — ABNORMAL LOW (ref 1.7–2.4)

## 2023-07-06 MED ORDER — MAGNESIUM SULFATE 4 GM/100ML IV SOLN
4.0000 g | Freq: Once | INTRAVENOUS | Status: AC
  Administered 2023-07-06: 4 g via INTRAVENOUS
  Filled 2023-07-06: qty 100

## 2023-07-06 MED ORDER — LACTATED RINGERS IV SOLN: INTRAVENOUS | Status: DC

## 2023-07-06 MED ORDER — METOPROLOL TARTRATE 25 MG PO TABS
25.0000 mg | ORAL_TABLET | Freq: Two times a day (BID) | ORAL | Status: DC
  Administered 2023-07-06: 25 mg via ORAL
  Filled 2023-07-06: qty 1

## 2023-07-06 MED ORDER — POTASSIUM PHOSPHATES 15 MMOLE/5ML IV SOLN
30.0000 mmol | Freq: Once | INTRAVENOUS | Status: AC
  Administered 2023-07-06: 30 mmol via INTRAVENOUS
  Filled 2023-07-06: qty 10

## 2023-07-06 MED ORDER — METOPROLOL TARTRATE 50 MG PO TABS: 50.0000 mg | ORAL_TABLET | Freq: Two times a day (BID) | ORAL | Status: DC

## 2023-07-06 NOTE — Plan of Care (Signed)
   Problem: Education: Goal: Knowledge of General Education information will improve Description Including pain rating scale, medication(s)/side effects and non-pharmacologic comfort measures Outcome: Progressing

## 2023-07-06 NOTE — Progress Notes (Signed)
 PROGRESS NOTE   Carlos Good  FMW:[id number] DOB: 1985-02-08 DOA: 07/04/2023 PCP: Pcp, No   Chief Complaint  Patient presents with   Emesis   Level of care: Telemetry  Brief Admission History:  38 y.o. male with past medical history of alcohol abuse, hypertension, noncompliance with medication, he came to ED due to complaints of tremors, nausea and vomiting, patient with heavy alcohol abuse, drinking a pint of liquor daily, most recently was yesterday, this morning he was at work, mowing in the hot sun all day, he began to have shakes, nausea, vomiting, as well loose stools, he denies abdominal pain, chest pain, fever or chills, patient had similar symptoms in April where he was in alcohol withdrawals.  In ED patient was noted to be significantly hypertensive, and alcohol withdrawals with significant tremors, labs were significant for hypomagnesemia, hypokalemia, total CK was elevated at 650, so [facility] hospitalist consulted to admit.   Assessment and Plan:  Alcohol abuse with withdrawals - Patient with history of heavy alcohol use, who presents with tremors, withdrawals, hypertensive and restless - He was counseled at bedside to stop abusing alcohol. - Started on CIWA protocol, started on scheduled librium  taper;  - Continue thiamine  and folic acid  and MVI   Rhabdomyolysis - RESOLVED  - CK elevated at 650, reports nausea, vomiting, as working on prolonged hours under the sun today - Continue with IV fluids - CK now normalized    Transaminitis - AST is elevated at 112   Hypertensive urgency Uncontrolled HTN due to nonadherence  - Patient with known history of blood pressure, does appear he is only on low-dose Norvasc , but has been noncompliant-blood pressure significantly elevated, systolic 209/195, this is most likely as well in the setting of alcohol withdrawals, - restarted amlodipine  5 mg, hydralazine  25 mg TID - monitor BP - increase metoprolol  to 50 mg BID     Hyponatremia -continue IV fluid hydration -recheck labs in AM   Hypokalemia -this is being repleted    Hypomagnesemia - severe  -this is being repleted  -recheck Mg in AM   Hypophosphatemia - IV Kphos ordered and given 7/5  - recheck in AM and if remains low replete further  DVT prophylaxis: SCDs Code Status: Full  Family Communication: bedside update 7/4,7/5  Disposition: home    Consultants:   Procedures:   Antimicrobials:    Subjective: Pt wanting to go home today, says he is feeling better   Objective: Vitals:   07/06/23 0557 07/06/23 1006 07/06/23 1235 07/06/23 1300  BP: (!) 165/114 (!) 172/106 (!) 155/106 (!) 141/95  Pulse: (!) 113 (!) 125 (!) 101 98  Resp: 18     Temp: 98.6 F (37 C)   98.7 F (37.1 C)  TempSrc: Oral   Oral  SpO2: 99%   100%  Weight:      Height:        Intake/Output Summary (Last 24 hours) at 07/06/2023 1632 Last data filed at 07/06/2023 0500 Gross per 24 hour  Intake 240 ml  Output --  Net 240 ml   Filed Weights   07/04/23 1308 07/04/23 2005  Weight: 77.1 kg 73 kg   Examination:  General exam: cachectic, emaciated male, Appears calm and comfortable  Respiratory system: Clear to auscultation. Respiratory effort normal. Cardiovascular system: normal S1 & S2 heard. No JVD, murmurs, rubs, gallops or clicks. No pedal edema. Gastrointestinal system: Abdomen is nondistended, soft and nontender. No organomegaly or masses felt. Normal bowel sounds heard. Central nervous  system: Alert and oriented. No focal neurological deficits. Extremities: Symmetric 5 x 5 power. Skin: No rashes, lesions or ulcers. Psychiatry: Judgement and insight appear normal. Mood & affect appropriate.   Data Reviewed: I have personally reviewed following labs and imaging studies  CBC: Recent Labs  Lab 07/04/23 1330 07/05/23 0415  WBC 4.5 4.8  NEUTROABS 3.2  --   HGB 13.4 12.0*  HCT 38.2* 35.2*  MCV 97.0 96.2  PLT 143* 117*    Basic Metabolic  Panel: Recent Labs  Lab 07/04/23 1330 07/05/23 0415 07/06/23 0424  NA 138 132* 132*  K 3.4* 2.7* 3.2*  CL 96* 97* 101  CO2 24 25 22   GLUCOSE 103* 89 88  BUN 6 <5* <5*  CREATININE 0.52* 0.42* 0.49*  CALCIUM 8.4* 7.2* 7.9*  MG 1.0* 1.4* 1.5*  PHOS  --  2.4* 2.1*    CBG: No results for input(s): GLUCAP in the last 168 hours.  Recent Results (from the past 240 hours)  MRSA Next Gen by PCR, Nasal     Status: None   Collection Time: 07/04/23  7:38 PM   Specimen: Nasal Mucosa; Nasal Swab  Result Value Ref Range Status   MRSA by PCR Next Gen NOT DETECTED NOT DETECTED Final    Comment: (NOTE) The GeneXpert MRSA Assay (FDA approved for NASAL specimens only), is one component of a comprehensive MRSA colonization surveillance program. It is not intended to diagnose MRSA infection nor to guide or monitor treatment for MRSA infections. Test performance is not FDA approved in patients less than 52 years old. Performed at [facility], [address] [place], [place] [address]      Radiology Studies: No results found.  Scheduled Meds:  amLODipine   5 mg Oral Daily   chlordiazePOXIDE   25 mg Oral TID   Followed by   NOREEN ON 07/07/2023] chlordiazePOXIDE   10 mg Oral TID   Followed by   NOREEN ON 07/09/2023] chlordiazePOXIDE   5 mg Oral TID   Chlorhexidine  Gluconate Cloth  6 each Topical Q0600   hydrALAZINE   25 mg Oral Q8H   metoprolol  tartrate  50 mg Oral BID   multivitamin with minerals  1 tablet Oral Daily   thiamine   100 mg Oral Daily   Or   thiamine   100 mg Intravenous Daily   Continuous Infusions:  lactated ringers  50 mL/hr at 07/06/23 1240   potassium PHOSPHATE  IVPB (in mmol) 30 mmol (07/06/23 1243)    LOS: 2 days   Time spent: 56 mins  Jerene Yeager Vicci, MD How to contact the [facility] Attending or Consulting provider 7A - 7P or covering provider during after hours 7P -7A, for this patient?  Check the care team in [facility] and look for a) attending/consulting [facility] provider listed  and b) the [facility] team listed Log into [email or web address] to find provider on call.  Locate the [facility] provider you are looking for under [facility] and page to a number that you can be directly reached. If you still have difficulty reaching the provider, please page the [facility] (Director on Call) for the Hospitalists listed on amion for assistance.  07/06/2023, 4:32 PM

## 2023-07-06 NOTE — Progress Notes (Signed)
 Patient left AMA. Paper signed and in chart.

## 2023-07-06 NOTE — Progress Notes (Signed)
 Pt elevated after PRN ativan  given for score on CIWA scale.On call MD notified that patient refused scheduled 0600 hydralazine  and requested home med amlodipine . MD gave verbal order to give scheduled 1000 amlodipine  early, RN administered med accordingly.      07/06/23 0557  Assess: MEWS Score  Temp 98.6 F (37 C)  BP (!) 165/114  MAP (mmHg) 129  Pulse Rate (!) 113  Resp 18  Level of Consciousness Alert  SpO2 99 %  O2 Device Room Air  Assess: MEWS Score  MEWS Temp 0  MEWS Systolic 0  MEWS Pulse 2  MEWS RR 0  MEWS LOC 0  MEWS Score 2  MEWS Score Color Yellow  Assess: if the MEWS score is Yellow or Red  Were vital signs accurate and taken at a resting state? Yes  Does the patient meet 2 or more of the SIRS criteria? No  MEWS guidelines implemented  Yes, yellow  Treat  MEWS Interventions Considered administering scheduled or prn medications/treatments as ordered  Take Vital Signs  Increase Vital Sign Frequency  Yellow: Q2hr x1, continue Q4hrs until patient remains green for 12hrs  Escalate  MEWS: Escalate Yellow: Discuss with charge nurse and consider notifying provider and/or RRT  Notify: Charge Nurse/RN  Name of Charge Nurse/RN Notified Metta, RN  Provider Notification  Provider Name/Title Adefeso  Date Provider Notified 07/06/23  Time Provider Notified [phone]  Method of Notification Page  Notification Reason Other (Comment) (elevated BP)  Provider response Other (Comment) (Verbal order to give scheduled 10am amlodipine  early)  Date of Provider Response 07/06/23  Time of Provider Response 0615  Assess: SIRS CRITERIA  SIRS Temperature  0  SIRS Respirations  0  SIRS Pulse 1  SIRS WBC 0  SIRS Score Sum  1

## 2023-11-16 IMAGING — DX DG CHEST 2V
2 series · 2 of 2 positions shown · non-contrast
Comparison: 03/02/2007.

CLINICAL DATA: Chest pain

EXAM:
CHEST - 2 VIEW

[chest pa]
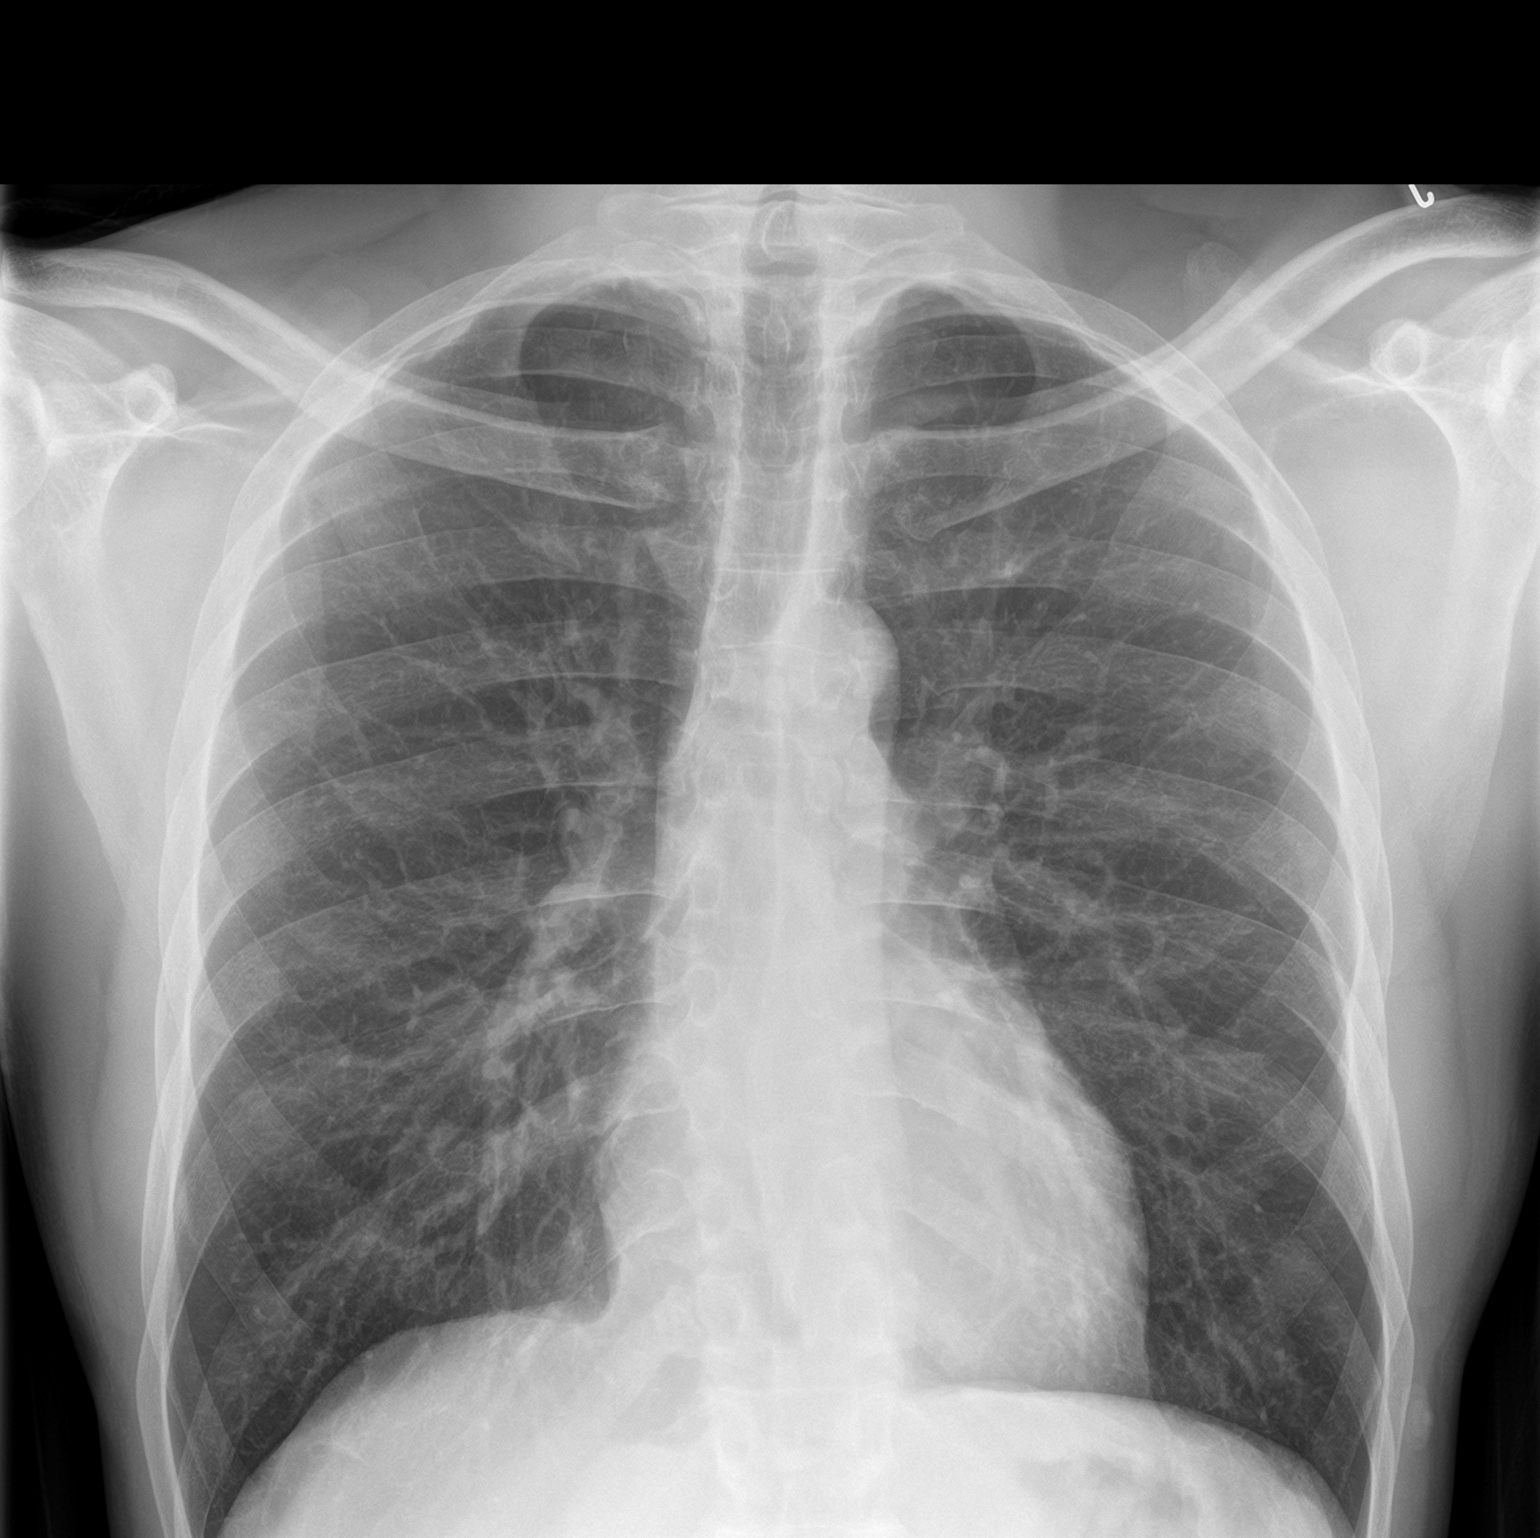

[chest lat]
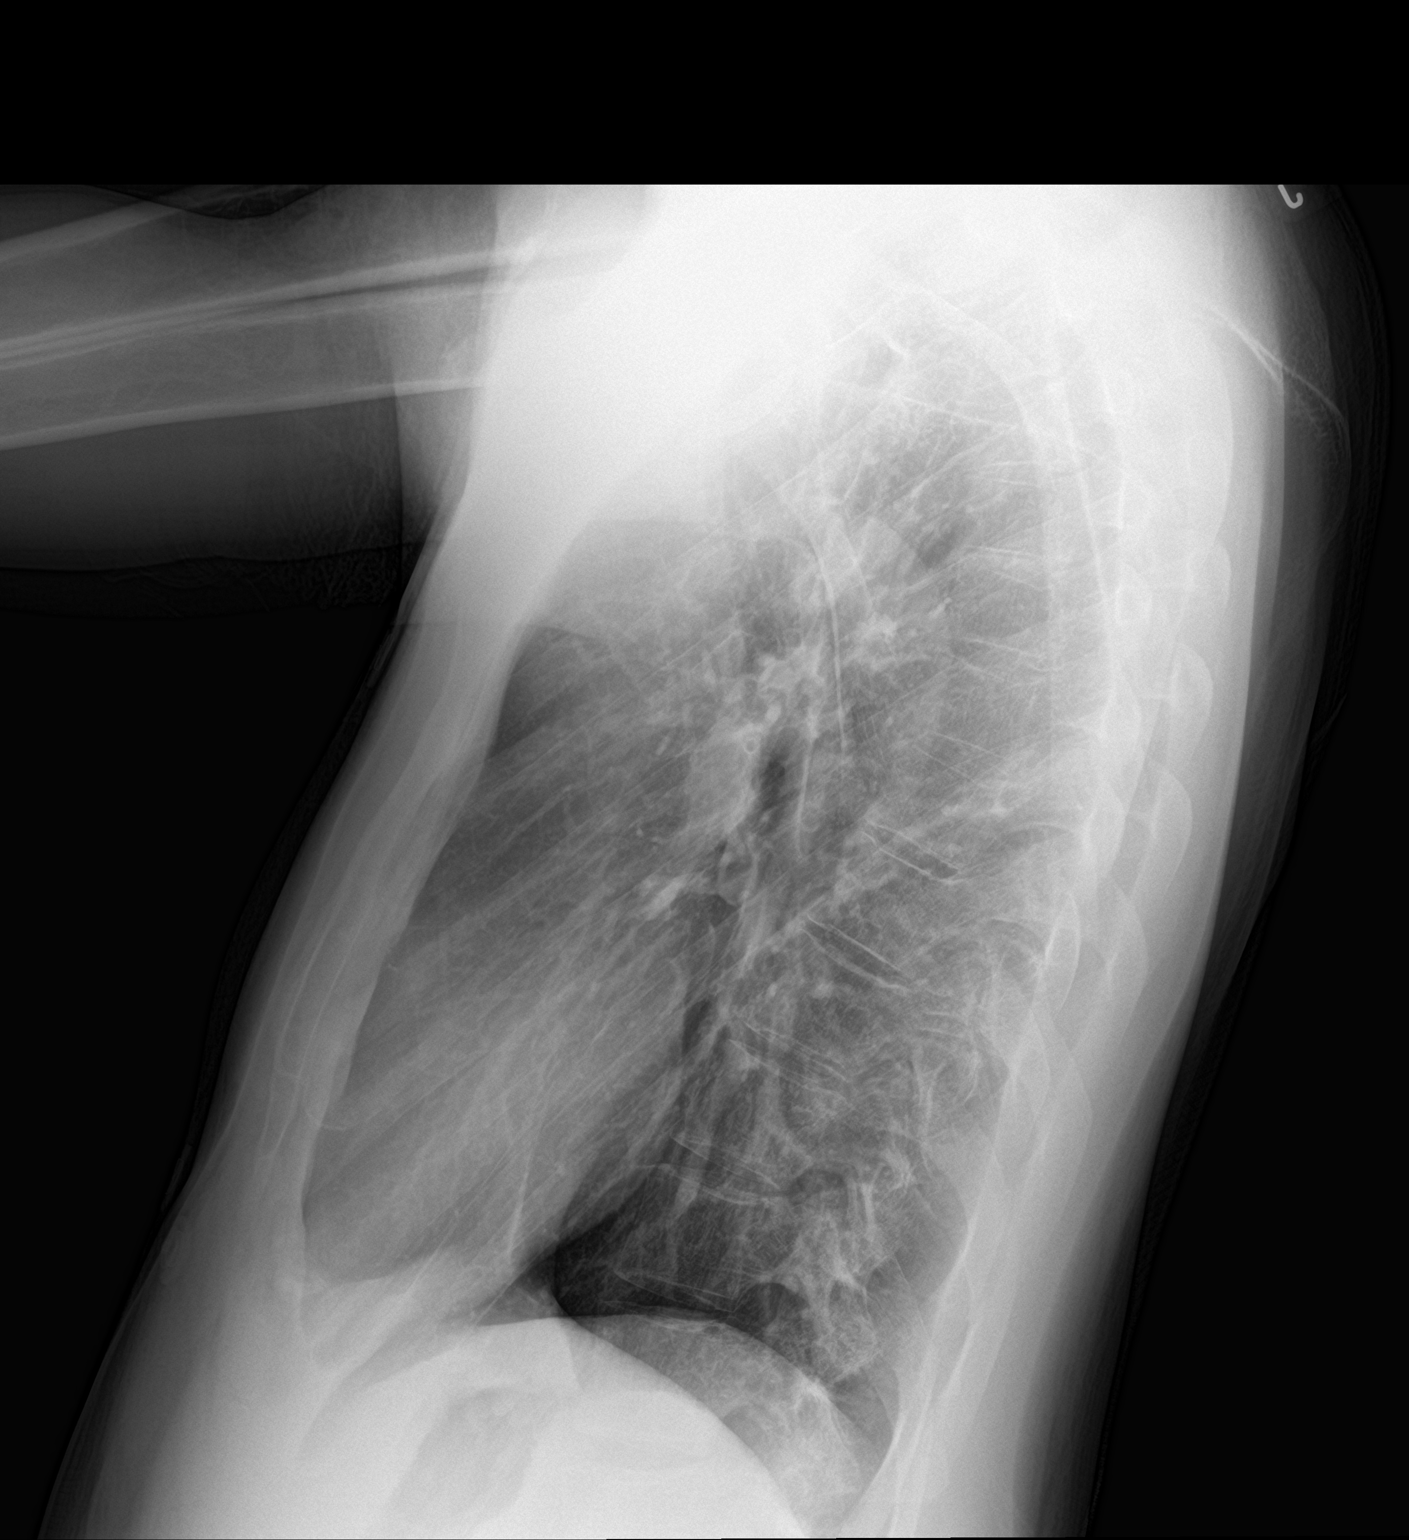

[2 of 2 positions shown; findings below may reference images not displayed]

FINDINGS: Cardiac and mediastinal contours are within normal limits. No focal
pulmonary opacity. No pleural effusion or pneumothorax. No acute
osseous abnormality.
IMPRESSION: No acute cardiopulmonary process.

## 2023-12-27 ENCOUNTER — Other Ambulatory Visit: Payer: Self-pay

## 2023-12-27 ENCOUNTER — Observation Stay (HOSPITAL_COMMUNITY): Payer: Self-pay

## 2023-12-27 ENCOUNTER — Inpatient Hospital Stay (HOSPITAL_COMMUNITY)
Admission: EM | Admit: 2023-12-27 | Discharge: 2023-12-29 | Disposition: A | Payer: Self-pay | Attending: Internal Medicine | Admitting: Internal Medicine

## 2023-12-27 ENCOUNTER — Encounter (HOSPITAL_COMMUNITY): Payer: Self-pay

## 2023-12-27 DIAGNOSIS — F17210 Nicotine dependence, cigarettes, uncomplicated: Secondary | ICD-10-CM | POA: Diagnosis present

## 2023-12-27 DIAGNOSIS — I160 Hypertensive urgency: Secondary | ICD-10-CM | POA: Diagnosis present

## 2023-12-27 DIAGNOSIS — R748 Abnormal levels of other serum enzymes: Secondary | ICD-10-CM

## 2023-12-27 DIAGNOSIS — E878 Other disorders of electrolyte and fluid balance, not elsewhere classified: Secondary | ICD-10-CM

## 2023-12-27 DIAGNOSIS — Y901 Blood alcohol level of 20-39 mg/100 ml: Secondary | ICD-10-CM | POA: Diagnosis present

## 2023-12-27 DIAGNOSIS — F10239 Alcohol dependence with withdrawal, unspecified: Secondary | ICD-10-CM | POA: Diagnosis present

## 2023-12-27 DIAGNOSIS — K2920 Alcoholic gastritis without bleeding: Secondary | ICD-10-CM | POA: Diagnosis present

## 2023-12-27 DIAGNOSIS — R1110 Vomiting, unspecified: Secondary | ICD-10-CM | POA: Diagnosis present

## 2023-12-27 DIAGNOSIS — Z716 Tobacco abuse counseling: Secondary | ICD-10-CM

## 2023-12-27 DIAGNOSIS — R1116 Cannabis hyperemesis syndrome: Secondary | ICD-10-CM | POA: Diagnosis present

## 2023-12-27 DIAGNOSIS — K7010 Alcoholic hepatitis without ascites: Principal | ICD-10-CM | POA: Diagnosis present

## 2023-12-27 DIAGNOSIS — Z23 Encounter for immunization: Secondary | ICD-10-CM

## 2023-12-27 DIAGNOSIS — D696 Thrombocytopenia, unspecified: Secondary | ICD-10-CM | POA: Diagnosis present

## 2023-12-27 DIAGNOSIS — R7401 Elevation of levels of liver transaminase levels: Secondary | ICD-10-CM

## 2023-12-27 DIAGNOSIS — I10 Essential (primary) hypertension: Secondary | ICD-10-CM | POA: Diagnosis present

## 2023-12-27 DIAGNOSIS — Z79899 Other long term (current) drug therapy: Secondary | ICD-10-CM

## 2023-12-27 DIAGNOSIS — E872 Acidosis, unspecified: Secondary | ICD-10-CM | POA: Diagnosis present

## 2023-12-27 DIAGNOSIS — Z1152 Encounter for screening for COVID-19: Secondary | ICD-10-CM

## 2023-12-27 DIAGNOSIS — F10939 Alcohol use, unspecified with withdrawal, unspecified: Principal | ICD-10-CM

## 2023-12-27 DIAGNOSIS — E876 Hypokalemia: Secondary | ICD-10-CM | POA: Diagnosis present

## 2023-12-27 LAB — COMPREHENSIVE METABOLIC PANEL WITH GFR
ALT: 50 U/L — ABNORMAL HIGH (ref 0–44)
AST: 142 U/L — ABNORMAL HIGH (ref 15–41)
Albumin: 5.1 g/dL — ABNORMAL HIGH (ref 3.5–5.0)
Alkaline Phosphatase: 72 U/L (ref 38–126)
Anion gap: 28 — ABNORMAL HIGH (ref 5–15)
BUN: 5 mg/dL — ABNORMAL LOW (ref 6–20)
CO2: 19 mmol/L — ABNORMAL LOW (ref 22–32)
Calcium: 8.2 mg/dL — ABNORMAL LOW (ref 8.9–10.3)
Chloride: 91 mmol/L — ABNORMAL LOW (ref 98–111)
Creatinine, Ser: 0.53 mg/dL — ABNORMAL LOW (ref 0.61–1.24)
GFR, Estimated: >60 mL/min
Glucose, Bld: 118 mg/dL — ABNORMAL HIGH (ref 70–99)
Potassium: 3.4 mmol/L — ABNORMAL LOW (ref 3.5–5.1)
Sodium: 138 mmol/L (ref 135–145)
Total Bilirubin: 0.8 mg/dL (ref 0.0–1.2)
Total Protein: 8.2 g/dL — ABNORMAL HIGH (ref 6.5–8.1)

## 2023-12-27 LAB — URINALYSIS, ROUTINE W REFLEX MICROSCOPIC
Bacteria, UA: NONE SEEN
Bilirubin Urine: NEGATIVE
Glucose, UA: 150 mg/dL — AB
Hgb urine dipstick: NEGATIVE
Ketones, ur: 20 mg/dL — AB
Leukocytes,Ua: NEGATIVE
Nitrite: NEGATIVE
Protein, ur: 100 mg/dL — AB
Specific Gravity, Urine: 1.017 (ref 1.005–1.030)
pH: 7 (ref 5.0–8.0)

## 2023-12-27 LAB — CBC
HCT: 40.7 % (ref 39.0–52.0)
Hemoglobin: 14.1 g/dL (ref 13.0–17.0)
MCH: 34.5 pg — ABNORMAL HIGH (ref 26.0–34.0)
MCHC: 34.6 g/dL (ref 30.0–36.0)
MCV: 99.5 fL (ref 80.0–100.0)
Platelets: 142 K/uL — ABNORMAL LOW (ref 150–400)
RBC: 4.09 MIL/uL — ABNORMAL LOW (ref 4.22–5.81)
RDW: 14.6 % (ref 11.5–15.5)
WBC: 6.1 K/uL (ref 4.0–10.5)
nRBC: 0 % (ref 0.0–0.2)

## 2023-12-27 LAB — URINE DRUG SCREEN
Amphetamines: NEGATIVE
Barbiturates: NEGATIVE
Benzodiazepines: NEGATIVE
Cocaine: NEGATIVE
Fentanyl: NEGATIVE
Methadone Scn, Ur: NEGATIVE
Opiates: NEGATIVE
Tetrahydrocannabinol: POSITIVE — AB

## 2023-12-27 LAB — RESP PANEL BY RT-PCR (RSV, FLU A&B, COVID)  RVPGX2
Influenza A by PCR: NEGATIVE
Influenza B by PCR: NEGATIVE
Resp Syncytial Virus by PCR: NEGATIVE
SARS Coronavirus 2 by RT PCR: NEGATIVE

## 2023-12-27 LAB — LIPASE, BLOOD: Lipase: 58 U/L — ABNORMAL HIGH (ref 11–51)

## 2023-12-27 LAB — ETHANOL: Alcohol, Ethyl (B): 26 mg/dL — ABNORMAL HIGH

## 2023-12-27 MED ORDER — HEPARIN SODIUM (PORCINE) 5000 UNIT/ML IJ SOLN
5000.0000 [IU] | Freq: Three times a day (TID) | INTRAMUSCULAR | Status: DC
  Administered 2023-12-27 – 2023-12-29 (×5): 5000 [IU] via SUBCUTANEOUS
  Filled 2023-12-27 (×5): qty 1

## 2023-12-27 MED ORDER — LORAZEPAM 1 MG PO TABS
1.0000 mg | ORAL_TABLET | ORAL | Status: DC | PRN
  Administered 2023-12-29: 1 mg via ORAL
  Filled 2023-12-27: qty 1

## 2023-12-27 MED ORDER — THIAMINE HCL 100 MG/ML IJ SOLN
100.0000 mg | Freq: Every day | INTRAMUSCULAR | Status: DC
  Administered 2023-12-27: 100 mg via INTRAVENOUS
  Filled 2023-12-27: qty 2

## 2023-12-27 MED ORDER — LORAZEPAM 2 MG/ML IJ SOLN
1.0000 mg | INTRAMUSCULAR | Status: DC | PRN
  Administered 2023-12-27 – 2023-12-28 (×4): 2 mg via INTRAVENOUS
  Filled 2023-12-27 (×4): qty 1

## 2023-12-27 MED ORDER — POTASSIUM CHLORIDE IN NACL 20-0.9 MEQ/L-% IV SOLN: INTRAVENOUS | Status: AC

## 2023-12-27 MED ORDER — PNEUMOCOCCAL 20-VAL CONJ VACC 0.5 ML IM SUSY
0.5000 mL | PREFILLED_SYRINGE | INTRAMUSCULAR | Status: AC
  Administered 2023-12-28: 0.5 mL via INTRAMUSCULAR
  Filled 2023-12-27: qty 0.5

## 2023-12-27 MED ORDER — PANTOPRAZOLE SODIUM 40 MG IV SOLR
40.0000 mg | Freq: Once | INTRAVENOUS | Status: AC
  Administered 2023-12-27: 40 mg via INTRAVENOUS
  Filled 2023-12-27: qty 10

## 2023-12-27 MED ORDER — HYDRALAZINE HCL 25 MG PO TABS
25.0000 mg | ORAL_TABLET | Freq: Three times a day (TID) | ORAL | Status: DC
  Administered 2023-12-27 – 2023-12-28 (×4): 25 mg via ORAL
  Filled 2023-12-27 (×4): qty 1

## 2023-12-27 MED ORDER — THIAMINE MONONITRATE 100 MG PO TABS
100.0000 mg | ORAL_TABLET | Freq: Every day | ORAL | Status: DC
  Administered 2023-12-28 – 2023-12-29 (×2): 100 mg via ORAL
  Filled 2023-12-27 (×3): qty 1

## 2023-12-27 MED ORDER — ONDANSETRON HCL 4 MG/2ML IJ SOLN
4.0000 mg | Freq: Once | INTRAMUSCULAR | Status: AC
  Administered 2023-12-27: 4 mg via INTRAVENOUS
  Filled 2023-12-27: qty 2

## 2023-12-27 MED ORDER — PROCHLORPERAZINE EDISYLATE 10 MG/2ML IJ SOLN
5.0000 mg | Freq: Four times a day (QID) | INTRAMUSCULAR | Status: AC
  Administered 2023-12-27 – 2023-12-28 (×6): 5 mg via INTRAVENOUS
  Filled 2023-12-27 (×6): qty 2

## 2023-12-27 MED ORDER — AMLODIPINE BESYLATE 5 MG PO TABS
5.0000 mg | ORAL_TABLET | Freq: Once | ORAL | Status: AC
  Administered 2023-12-27: 5 mg via ORAL
  Filled 2023-12-27: qty 1

## 2023-12-27 MED ORDER — ACETAMINOPHEN 650 MG RE SUPP: 650.0000 mg | Freq: Four times a day (QID) | RECTAL | Status: DC | PRN

## 2023-12-27 MED ORDER — ADULT MULTIVITAMIN W/MINERALS CH
1.0000 | ORAL_TABLET | Freq: Every day | ORAL | Status: DC
  Administered 2023-12-27 – 2023-12-29 (×3): 1 via ORAL
  Filled 2023-12-27 (×3): qty 1

## 2023-12-27 MED ORDER — INFLUENZA VIRUS VACC SPLIT PF (FLUZONE) 0.5 ML IM SUSY
0.5000 mL | PREFILLED_SYRINGE | INTRAMUSCULAR | Status: AC
  Administered 2023-12-28: 0.5 mL via INTRAMUSCULAR
  Filled 2023-12-27: qty 0.5

## 2023-12-27 MED ORDER — ACETAMINOPHEN 325 MG PO TABS: 650.0000 mg | ORAL_TABLET | Freq: Four times a day (QID) | ORAL | Status: DC | PRN

## 2023-12-27 MED ORDER — AMLODIPINE BESYLATE 5 MG PO TABS
5.0000 mg | ORAL_TABLET | Freq: Every day | ORAL | Status: DC
  Administered 2023-12-27 – 2023-12-28 (×2): 5 mg via ORAL
  Filled 2023-12-27 (×2): qty 1

## 2023-12-27 MED ORDER — FOLIC ACID 1 MG PO TABS
1.0000 mg | ORAL_TABLET | Freq: Every day | ORAL | Status: DC
  Administered 2023-12-27 – 2023-12-29 (×3): 1 mg via ORAL
  Filled 2023-12-27 (×3): qty 1

## 2023-12-27 MED ORDER — PANTOPRAZOLE SODIUM 40 MG IV SOLR
40.0000 mg | Freq: Two times a day (BID) | INTRAVENOUS | Status: DC
  Administered 2023-12-27 – 2023-12-29 (×4): 40 mg via INTRAVENOUS
  Filled 2023-12-27 (×4): qty 10

## 2023-12-27 NOTE — ED Provider Notes (Signed)
 " [place] [facility] AT [facility] Provider Note   CSN: [id number] Arrival date & time: 12/27/23  1019     Patient presents with: Emesis   Carlos Good is a 38 y.o. male.   38 year old male presenting with nausea/vomiting/tremors.  Patient endorses a pint or more of liquor use daily, has not drank in > 24hrs, no history of alcohol withdrawal seizures or DTs.  Was hospitalized in July for alcohol abuse.  Patient's girlfriend reports that his vomit was dark/black this morning, he reports that it was more [place] in color.  No history of GI bleed to his knowledge, denies melena/hematochezia.  Denies abdominal pain, chest pain, shortness of breath.   Emesis      Prior to Admission medications  Medication Sig Start Date End Date Taking? Authorizing Provider  amLODipine  (NORVASC ) 2.5 MG tablet Take 2.5 mg by mouth daily.    [provider]    Allergies: Patient has no known allergies.    Review of Systems  Gastrointestinal:  Positive for vomiting.    Updated Vital Signs  Vitals:   12/27/23 1104 12/27/23 1109  Pulse: 96   Resp: 18   Temp: 98.4 F (36.9 C)   TempSrc: Oral   SpO2: 98%   Weight:  73 kg  Height:  6' 3 (1.905 m)     Physical Exam Vitals and nursing note reviewed.  HENT:     Head: Normocephalic.  Eyes:     Extraocular Movements: Extraocular movements intact.  Cardiovascular:     Rate and Rhythm: Regular rhythm. Tachycardia present.  Pulmonary:     Effort: Pulmonary effort is normal.     Breath sounds: Normal breath sounds.  Abdominal:     Palpations: Abdomen is soft.     Tenderness: There is no abdominal tenderness. There is no guarding.  Musculoskeletal:     Cervical back: Normal range of motion.     Comments: Moves all extremities spontaneously without difficulty  Skin:    General: Skin is warm and dry.  Neurological:     General: No focal deficit present.     Mental Status: He is alert and oriented to  person, place, and time.     Comments: Diffuse tremor      (all labs ordered are listed, but only abnormal results are displayed) Labs Reviewed  LIPASE, BLOOD - Abnormal; Notable for the following components:      Result Value   Lipase 58 (*)    All other components within normal limits  COMPREHENSIVE METABOLIC PANEL WITH GFR - Abnormal; Notable for the following components:   Potassium 3.4 (*)    Chloride 91 (*)    CO2 19 (*)    Glucose, Bld 118 (*)    BUN 5 (*)    Creatinine, Ser 0.53 (*)    Calcium 8.2 (*)    Total Protein 8.2 (*)    Albumin 5.1 (*)    AST 142 (*)    ALT 50 (*)    Anion gap 28 (*)    All other components within normal limits  CBC - Abnormal; Notable for the following components:   RBC 4.09 (*)    MCH 34.5 (*)    Platelets 142 (*)    All other components within normal limits  URINALYSIS, ROUTINE W REFLEX MICROSCOPIC - Abnormal; Notable for the following components:   Glucose, UA 150 (*)    Ketones, ur 20 (*)    Protein, ur 100 (*)  All other components within normal limits  ETHANOL - Abnormal; Notable for the following components:   Alcohol, Ethyl (B) 26 (*)    All other components within normal limits  URINE DRUG SCREEN - Abnormal; Notable for the following components:   Tetrahydrocannabinol POSITIVE (*)    All other components within normal limits  RESP PANEL BY RT-PCR (RSV, FLU A&B, COVID)  [id number]    EKG: None  Radiology: No results found.   Procedures   Medications Ordered in the ED  LORazepam  (ATIVAN ) tablet 1-4 mg ( Oral See Alternative 12/27/23 1208)    Or  LORazepam  (ATIVAN ) injection 1-4 mg (2 mg Intravenous Given 12/27/23 1208)  thiamine  (VITAMIN B1) tablet 100 mg ( Oral See Alternative 12/27/23 1158)    Or  thiamine  (VITAMIN B1) injection 100 mg (100 mg Intravenous Given 12/27/23 1158)  folic acid  (FOLVITE ) tablet 1 mg (1 mg Oral Given 12/27/23 1158)  multivitamin with minerals tablet 1 tablet (1 tablet Oral Given 12/27/23  1158)  ondansetron  (ZOFRAN ) injection 4 mg (4 mg Intravenous Given 12/27/23 1158)  pantoprazole  (PROTONIX ) injection 40 mg (40 mg Intravenous Given 12/27/23 1158)  amLODipine  (NORVASC ) tablet 5 mg (5 mg Oral Given 12/27/23 1358)                                    Medical Decision Making This patient presents to the ED for concern of alcohol withdrawal/nausea/vomiting, this involves an extensive number of treatment options, and is a complaint that carries with it a high risk of complications and morbidity.  The differential diagnosis includes complications of alcohol withdrawal including alcohol withdrawal seizure and DTs, pancreatitis, viral gastroenteritis, cannabinoid hyperemesis   Co morbidities that complicate the patient evaluation  History of alcohol use disorder   Additional history obtained:  Additional history obtained from record review External records from outside source obtained and reviewed including prior ED note   Lab Tests:  I Ordered, and personally interpreted labs.  The pertinent results include: CBC with borderline thrombocytopenia which is consistent with patient's baseline.  CMP with borderline hypokalemia with potassium of 3.4, elevations in AST/ALT, anion gap significantly elevated at 28.  Ethanol 26.  Urinalysis notable for glucosuria/ketones/protein.  COVID/flu/RSV negative.  Lipase minimally elevated at 58. UDS positive for THC.    Cardiac Monitoring: / EKG:  The patient was maintained on a cardiac monitor.  I personally viewed and interpreted the cardiac monitored which showed an underlying rhythm of: NSR   Consultations Obtained:  I requested consultation with the hospitalist,  and discussed lab and imaging findings as well as pertinent plan - they recommend: I spoke with Dr. Evonnie who agrees that this patient is appropriate for admission given concern for alcohol withdrawal   Problem List / ED Course / Critical interventions / Medication  management  I ordered medication including Zofran  for nausea, Protonix  for suspected alcohol gastritis, amlodipine  for hypertension, lorazepam /folic acid /multivitamin/thiamine  and accordance with CIWA protocol given concern for alcohol withdrawal I have reviewed the patients home medicines and have made adjustments as needed   Social Determinants of Health:  Tobacco use   Test / Admission - Considered:  Physical exam notable as above, patient is tachycardic during my assessment with a diffuse tremor, he is alert and oriented and is not demonstrating any signs of confusion/acute encephalopathy.  Heavy daily alcohol use with subjective complaint of dark vomiting raises concern for upper GI bleed.  Patient is at risk for severe complications of alcohol withdrawal, CIWA protocol initiated. Labs notable as above.  Anion gap likely elevated in the setting of alcoholic ketoacidosis.  Patient does have a mild elevation in his lipase, however given benign abdominal exam I do not feel that CT imaging is warranted at this time, low suspicion for acute pancreatitis.  Patient symptoms seem most consistent with acute alcohol withdrawal, concern for clinical worsening and progression to complications like a seizure/DTs without further treatment, based on this I do feel that patient would benefit from admission, I spoke with the hospitalist as above who is in agreement with this plan.     Amount and/or Complexity of Data Reviewed Labs: ordered.  Risk OTC drugs. Prescription drug management. Decision regarding hospitalization.        Final diagnoses:  Alcohol withdrawal syndrome with complication (HCC)  High anion gap  Transaminitis  Elevated lipase  Hypertension, unspecified type    ED Discharge Orders     None          Glendia Rocky SAILOR, PA-C 12/27/23 1448    Bernard Drivers, MD 12/28/23 1304  "

## 2023-12-27 NOTE — ED Triage Notes (Signed)
 Pt having shaking, vomiting, and diarrhea starting last night. Pt reports heavy drinking. Pt diaphoretic. Pt denies headache or dizziness.

## 2023-12-27 NOTE — H&P (Signed)
 " History and Physical    Patient: Carlos Good FMW:[id number] DOB: May 02, 1985 DOA: 12/27/2023 DOS: the patient was seen and examined on 12/27/2023 PCP: Pcp, No  Patient coming from: Home  Chief Complaint:  Chief Complaint  Patient presents with   Emesis   HPI: Carlos Good is a 38 year old male with history of alcohol abuse, hypertension, poor compliance presenting to emergency department secondary to tremors, nausea, vomiting that started in the early morning 12/27/2023.  The patient states that he drinks a pint of vodka on a daily basis.  His last drink was in the evening of 12/25/2023.  He states that he smokes 2 to 3 cigarettes a day.  He smokes THC 3 times per week.  He denies illicit drugs.  He denies any fevers, chills, headache, chest pain, shortness breath, cough, hemoptysis.  He denies any abdominal pain.  He denies any hematemesis.  He felt like his emesis was bilious in nature without any coffee grounds or bright red blood. There is no hematochezia or melena.  No dysuria or hematuria.  He states that he has not taken his antihypertensive medications in a while.  In the ED, the patient was afebrile and hemodynamically stable with oxygen saturation 90% on room air.  WBC 6.1, hemoglobin 14.1, platelet 142.  Sodium 138, potassium 3.4, bicarbonate 19, serum creatinine 0.53.  AST 140, ALT 50, alk phosphatase 72, total bilirubin 0.8.  COVID-19 PCR is negative.  UA was negative for pyuria.  UDS was positive for THC.  Alcohol level 26. Upon arrival the patient was tremulous and diaphoretic.  He was given Ativan  with some improvement of his symptoms.  Review of Systems: As mentioned in the history of present illness. All other systems reviewed and are negative. Past Medical History:  Diagnosis Date   ETOH abuse    Hypertension    History reviewed. No pertinent surgical history. Social History:  reports that he has been smoking cigarettes. He has never used smokeless tobacco. He reports  current alcohol use of about 7.0 standard drinks of alcohol per week. He reports that he does not use drugs.  Allergies[1]  History reviewed. No pertinent family history.  Prior to Admission medications  Medication Sig Start Date End Date Taking? Authorizing Provider  amLODipine  (NORVASC ) 2.5 MG tablet Take 2.5 mg by mouth daily.    [provider]    Physical Exam: Vitals:   12/27/23 1104 12/27/23 1105 12/27/23 1109 12/27/23 1214  BP:  (!) 198/132  (!) 174/126  Pulse: 96 92  (!) 109  Resp: 18     Temp: 98.4 F (36.9 C)     TempSrc: Oral     SpO2: 98% 98%    Weight:   73 kg   Height:   6' 3 (1.905 m)   GENERAL:  A&O x 3, NAD, well developed, cooperative, follows commands HEENT: [place]/AT, No thrush, No icterus, No oral ulcers Neck:  No neck mass, No meningismus, soft, supple CV: RRR, no S3, no S4, no rub, no JVD Lungs: Diminished breath sounds, but CTA, no wheeze, no rhonchi, good air movement Abd: soft/NT +BS, nondistended Ext: No edema, no lymphangitis, no cyanosis, no rashes Neuro:  CN II-XII intact, strength 4/5 in RUE, RLE, strength 4/5 LUE, LLE; sensation intact bilateral; no dysmetria; babinski equivocal  Data Reviewed: Data reviewed above in the history  Assessment and Plan: Intractable nausea and vomiting - Secondary to alcoholic gastritis and a component of cannabis hyperemesis syndrome - Start Compazine  around-the-clock -  Start pantoprazole  - Continue IV fluids  Alcohol withdrawal - Alcohol withdrawal protocol  Hypertensive urgency - Patient presented with blood pressure 198/132 - Restart hydralazine  and amlodipine   Hypokalemia - Replete - Check magnesium   Alcoholic hepatitis - Monitor LFTs  AGMA - Secondary to alcoholic ketosis - Continue IV fluids  Tobacco abuse - Tobacco cessation discussed   Advance Care Planning: FULL  Consults: none  Family Communication: none present  Severity of Illness: The appropriate patient status for  this patient is OBSERVATION. Observation status is judged to be reasonable and necessary in order to provide the required intensity of service to ensure the patient's safety. The patient's presenting symptoms, physical exam findings, and initial radiographic and laboratory data in the context of their medical condition is felt to place them at decreased risk for further clinical deterioration. Furthermore, it is anticipated that the patient will be medically stable for discharge from the hospital within 2 midnights of admission.   Author: Alm Schneider, MD 12/27/2023 2:08 PM  For on call review [email or web address].     [1] No Known Allergies  "

## 2023-12-27 NOTE — ED Notes (Signed)
 Resources for outpatient and residential substance use and counseling placed on AVS.

## 2023-12-27 NOTE — Hospital Course (Signed)
 38 year old male with history of alcohol abuse, hypertension, poor compliance presenting to emergency department secondary to tremors, nausea, vomiting that started in the early morning 12/27/2023.  The patient states that he drinks a pint of vodka on a daily basis.  His last drink was in the evening of 12/25/2023.  He states that he smokes 2 to 3 cigarettes a day.  He smokes THC 3 times per week.  He denies illicit drugs.  He denies any fevers, chills, headache, chest pain, shortness breath, cough, hemoptysis.  He denies any abdominal pain.  He denies any hematemesis.  He felt like his emesis was bilious in nature without any coffee grounds or bright red blood. There is no hematochezia or melena.  No dysuria or hematuria.  He states that he has not taken his antihypertensive medications in a while.  In the ED, the patient was afebrile and hemodynamically stable with oxygen saturation 90% on room air.  WBC 6.1, hemoglobin 14.1, platelet 142.  Sodium 138, potassium 3.4, bicarbonate 19, serum creatinine 0.53.  AST 140, ALT 50, alk phosphatase 72, total bilirubin 0.8.  COVID-19 PCR is negative.  UA was negative for pyuria.  UDS was positive for THC.  Alcohol level 26. Upon arrival the patient was tremulous and diaphoretic.  He was given Ativan  with some improvement of his symptoms.

## 2023-12-27 NOTE — ED Notes (Signed)
 Family at bedside.

## 2023-12-28 LAB — COMPREHENSIVE METABOLIC PANEL WITH GFR
ALT: 38 U/L (ref 0–44)
AST: 91 U/L — ABNORMAL HIGH (ref 15–41)
Albumin: 4.1 g/dL (ref 3.5–5.0)
Alkaline Phosphatase: 57 U/L (ref 38–126)
Anion gap: 12 (ref 5–15)
BUN: <5 mg/dL — ABNORMAL LOW (ref 6–20)
CO2: 29 mmol/L (ref 22–32)
Calcium: 7.6 mg/dL — ABNORMAL LOW (ref 8.9–10.3)
Chloride: 94 mmol/L — ABNORMAL LOW (ref 98–111)
Creatinine, Ser: 0.55 mg/dL — ABNORMAL LOW (ref 0.61–1.24)
GFR, Estimated: >60 mL/min
Glucose, Bld: 89 mg/dL (ref 70–99)
Potassium: 3.1 mmol/L — ABNORMAL LOW (ref 3.5–5.1)
Sodium: 135 mmol/L (ref 135–145)
Total Bilirubin: 1.1 mg/dL (ref 0.0–1.2)
Total Protein: 6.6 g/dL (ref 6.5–8.1)

## 2023-12-28 LAB — CBC
HCT: 36.4 % — ABNORMAL LOW (ref 39.0–52.0)
Hemoglobin: 12.8 g/dL — ABNORMAL LOW (ref 13.0–17.0)
MCH: 34 pg (ref 26.0–34.0)
MCHC: 35.2 g/dL (ref 30.0–36.0)
MCV: 96.8 fL (ref 80.0–100.0)
Platelets: 132 K/uL — ABNORMAL LOW (ref 150–400)
RBC: 3.76 MIL/uL — ABNORMAL LOW (ref 4.22–5.81)
RDW: 14 % (ref 11.5–15.5)
WBC: 4.9 K/uL (ref 4.0–10.5)
nRBC: 0 % (ref 0.0–0.2)

## 2023-12-28 LAB — MAGNESIUM: Magnesium: 1.2 mg/dL — ABNORMAL LOW (ref 1.7–2.4)

## 2023-12-28 LAB — PHOSPHORUS: Phosphorus: 3.2 mg/dL (ref 2.5–4.6)

## 2023-12-28 MED ORDER — POTASSIUM CHLORIDE CRYS ER 20 MEQ PO TBCR
40.0000 meq | EXTENDED_RELEASE_TABLET | Freq: Once | ORAL | Status: AC
  Administered 2023-12-28: 40 meq via ORAL
  Filled 2023-12-28: qty 2

## 2023-12-28 MED ORDER — MAGNESIUM SULFATE 2 GM/50ML IV SOLN
2.0000 g | Freq: Once | INTRAVENOUS | Status: AC
  Administered 2023-12-28: 2 g via INTRAVENOUS
  Filled 2023-12-28: qty 50

## 2023-12-28 MED ORDER — HYDRALAZINE HCL 50 MG PO TABS
50.0000 mg | ORAL_TABLET | Freq: Three times a day (TID) | ORAL | Status: DC
  Administered 2023-12-28 – 2023-12-29 (×2): 50 mg via ORAL
  Filled 2023-12-28 (×2): qty 1

## 2023-12-28 MED ORDER — AMLODIPINE BESYLATE 5 MG PO TABS
10.0000 mg | ORAL_TABLET | Freq: Every day | ORAL | Status: DC
  Administered 2023-12-29: 10 mg via ORAL
  Filled 2023-12-28: qty 2

## 2023-12-28 MED ORDER — LACTATED RINGERS IV SOLN: INTRAVENOUS | Status: AC

## 2023-12-28 NOTE — Plan of Care (Signed)

## 2023-12-28 NOTE — Progress Notes (Signed)
 "          PROGRESS NOTE  DREXLER MALAND FMW:[id number] DOB: 03-Nov-1985 DOA: 12/27/2023 PCP: Pcp, No  Brief History:  38 year old male with history of alcohol abuse, hypertension, poor compliance presenting to emergency department secondary to tremors, nausea, vomiting that started in the early morning 12/27/2023.  The patient states that he drinks a pint of vodka on a daily basis.  His last drink was in the evening of 12/25/2023.  He states that he smokes 2 to 3 cigarettes a day.  He smokes THC 3 times per week.  He denies illicit drugs.  He denies any fevers, chills, headache, chest pain, shortness breath, cough, hemoptysis.  He denies any abdominal pain.  He denies any hematemesis.  He felt like his emesis was bilious in nature without any coffee grounds or bright red blood. There is no hematochezia or melena.  No dysuria or hematuria.  He states that he has not taken his antihypertensive medications in a while.  In the ED, the patient was afebrile and hemodynamically stable with oxygen saturation 90% on room air.  WBC 6.1, hemoglobin 14.1, platelet 142.  Sodium 138, potassium 3.4, bicarbonate 19, serum creatinine 0.53.  AST 140, ALT 50, alk phosphatase 72, total bilirubin 0.8.  COVID-19 PCR is negative.  UA was negative for pyuria.  UDS was positive for THC.  Alcohol level 26. Upon arrival the patient was tremulous and diaphoretic.  He was given Ativan  with some improvement of his symptoms.   Assessment/Plan: Intractable nausea and vomiting - Secondary to alcoholic gastritis and a component of cannabis hyperemesis syndrome - d/c compazine  - Continue pantoprazole  - Continue IV fluids - UA neg for pyuria   Alcohol withdrawal - Alcohol withdrawal protocol   Hypertensive urgency - Patient presented with blood pressure 198/132 - Restart hydralazine  and amlodipine    Hypokalemia/Hypomagnesemia - Replete   Alcoholic hepatitis - Monitor LFTs--improving   AGMA - Secondary to alcoholic  ketosis - Continue IV fluids   Tobacco abuse - Tobacco cessation discussed   Thrombocytopenia -stable -monitor for bleeding       Family Communication:  no Family at bedside  Consultants:  none  Code Status:  FULL   DVT Prophylaxis:  [place] Heparin    Procedures: As Listed in Progress Note Above  Antibiotics: None      Subjective: Pt states n/v are improving.  Denies cp, sob, abd pain hematochezia, melena  Objective: Vitals:   12/28/23 0450 12/28/23 0901 12/28/23 1049 12/28/23 1349  BP: (!) 163/111 (!) 148/88 (!) 153/101 (!) 148/103  Pulse: 94 99 71 97  Resp: 18 18  20   Temp: 98.5 F (36.9 C) 99.3 F (37.4 C)  98.4 F (36.9 C)  TempSrc: Oral Oral  Oral  SpO2: 96% 99% 99% 99%  Weight:      Height:        Intake/Output Summary (Last 24 hours) at 12/28/2023 1757 Last data filed at 12/28/2023 1528 Gross per 24 hour  Intake 2813.38 ml  Output --  Net 2813.38 ml   Weight change:  Exam:  General:  Pt is alert, follows commands appropriately, not in acute distress HEENT: No icterus, No thrush, No neck mass, [place]/AT Cardiovascular: RRR, S1/S2, no rubs, no gallops Respiratory: CTA bilaterally, no wheezing, no crackles, no rhonchi Abdomen: Soft/+BS, non tender, non distended, no guarding Extremities: No edema, No lymphangitis, No petechiae, No rashes, no synovitis   Data Reviewed: I have personally reviewed following labs and imaging studies Basic Metabolic Panel:  Recent Labs  Lab 12/27/23 1112 12/28/23 0621  NA 138 135  K 3.4* 3.1*  CL 91* 94*  CO2 19* 29  GLUCOSE 118* 89  BUN 5* <5*  CREATININE 0.53* 0.55*  CALCIUM 8.2* 7.6*  MG  --  1.2*  PHOS  --  3.2   Liver Function Tests: Recent Labs  Lab 12/27/23 1112 12/28/23 0621  AST 142* 91*  ALT 50* 38  ALKPHOS 72 57  BILITOT 0.8 1.1  PROT 8.2* 6.6  ALBUMIN 5.1* 4.1   Recent Labs  Lab 12/27/23 1112  LIPASE 58*   No results for input(s): AMMONIA in the last 168 hours. Coagulation  Profile: No results for input(s): INR, PROTIME in the last 168 hours. CBC: Recent Labs  Lab 12/27/23 1112 12/28/23 0621  WBC 6.1 4.9  HGB 14.1 12.8*  HCT 40.7 36.4*  MCV 99.5 96.8  PLT 142* 132*   Cardiac Enzymes: No results for input(s): CKTOTAL, CKMB, CKMBINDEX, TROPONINI in the last 168 hours. BNP: Invalid input(s): POCBNP CBG: No results for input(s): GLUCAP in the last 168 hours. HbA1C: No results for input(s): HGBA1C in the last 72 hours. Urine analysis:    Component Value Date/Time   COLORURINE YELLOW 12/27/2023 1259   APPEARANCEUR CLEAR 12/27/2023 1259   LABSPEC 1.017 12/27/2023 1259   PHURINE 7.0 12/27/2023 1259   GLUCOSEU 150 (A) 12/27/2023 1259   HGBUR NEGATIVE 12/27/2023 1259   BILIRUBINUR NEGATIVE 12/27/2023 1259   BILIRUBINUR small (A) 11/18/2021 1618   KETONESUR 20 (A) 12/27/2023 1259   PROTEINUR 100 (A) 12/27/2023 1259   UROBILINOGEN 1.0 11/18/2021 1618   NITRITE NEGATIVE 12/27/2023 1259   LEUKOCYTESUR NEGATIVE 12/27/2023 1259   Sepsis Labs: @LABRCNTIP (procalcitonin:4,lacticidven:4) ) Recent Results (from the past 240 hours)  Resp panel by RT-PCR (RSV, Flu A&B, Covid) Anterior Nasal Swab     Status: None   Collection Time: 12/27/23 11:14 AM   Specimen: Anterior Nasal Swab  Result Value Ref Range Status   SARS Coronavirus 2 by RT PCR NEGATIVE NEGATIVE Final    Comment: (NOTE) SARS-CoV-2 target nucleic acids are NOT DETECTED.  The SARS-CoV-2 RNA is generally detectable in upper respiratory specimens during the acute phase of infection. The lowest concentration of SARS-CoV-2 viral copies this assay can detect is 138 copies/mL. A negative result does not preclude SARS-Cov-2 infection and should not be used as the sole basis for treatment or other patient management decisions. A negative result may occur with  improper specimen collection/handling, submission of specimen other than nasopharyngeal swab, presence of viral mutation(s)  within the areas targeted by this assay, and inadequate number of viral copies(<138 copies/mL). A negative result must be combined with clinical observations, patient history, and epidemiological information. The expected result is Negative.  Fact Sheet for Patients:  bloggercourse.com  Fact Sheet for Healthcare Providers:  seriousbroker.it  This test is no t yet approved or cleared by the [place]  FDA and  has been authorized for detection and/or diagnosis of SARS-CoV-2 by FDA under an Emergency Use Authorization (EUA). This EUA will remain  in effect (meaning this test can be used) for the duration of the COVID-19 declaration under Section 564(b)(1) of the Act, 21 U.S.C.section 360bbb-3(b)(1), unless the authorization is terminated  or revoked sooner.       Influenza A by PCR NEGATIVE NEGATIVE Final   Influenza B by PCR NEGATIVE NEGATIVE Final    Comment: (NOTE) The Xpert Xpress SARS-CoV-2/FLU/RSV plus assay is intended as an aid in the diagnosis of  influenza from Nasopharyngeal swab specimens and should not be used as a sole basis for treatment. Nasal washings and aspirates are unacceptable for Xpert Xpress SARS-CoV-2/FLU/RSV testing.  Fact Sheet for Patients: bloggercourse.com  Fact Sheet for Healthcare Providers: seriousbroker.it  This test is not yet approved or cleared by the [place]  FDA and has been authorized for detection and/or diagnosis of SARS-CoV-2 by FDA under an Emergency Use Authorization (EUA). This EUA will remain in effect (meaning this test can be used) for the duration of the COVID-19 declaration under Section 564(b)(1) of the Act, 21 U.S.C. section 360bbb-3(b)(1), unless the authorization is terminated or revoked.     Resp Syncytial Virus by PCR NEGATIVE NEGATIVE Final    Comment: (NOTE) Fact Sheet for  Patients: bloggercourse.com  Fact Sheet for Healthcare Providers: seriousbroker.it  This test is not yet approved or cleared by the [place]  FDA and has been authorized for detection and/or diagnosis of SARS-CoV-2 by FDA under an Emergency Use Authorization (EUA). This EUA will remain in effect (meaning this test can be used) for the duration of the COVID-19 declaration under Section 564(b)(1) of the Act, 21 U.S.C. section 360bbb-3(b)(1), unless the authorization is terminated or revoked.  Performed at [facility], [address], [place], [place] [address]      Scheduled Meds:  amLODipine   5 mg Oral Daily   folic acid   1 mg Oral Daily   heparin   5,000 Units Subcutaneous Q8H   hydrALAZINE   25 mg Oral Q8H   multivitamin with minerals  1 tablet Oral Daily   pantoprazole  (PROTONIX ) IV  40 mg Intravenous Q12H   prochlorperazine   5 mg Intravenous Q6H   thiamine   100 mg Oral Daily   Or   thiamine   100 mg Intravenous Daily   Continuous Infusions:  Procedures/Studies: US  Abdomen Limited RUQ (LIVER/GB) Result Date: 12/27/2023 EXAM: Right Upper Quadrant Abdominal Ultrasound 12/27/2023 03:34:26 PM TECHNIQUE: Real-time ultrasonography of the right upper quadrant of the abdomen was performed. COMPARISON: None available. CLINICAL HISTORY: Transaminasemia. FINDINGS: LIVER: Increased echogenicity. No intrahepatic biliary ductal dilatation. No evidence of mass. Hepatopetal flow in the portal vein. BILIARY SYSTEM: Gallbladder wall thickness measures 2.5 mm. No pericholecystic fluid. No cholelithiasis. Common bile duct is within normal limits measuring 2.8 mm. OTHER: No right upper quadrant ascites. IMPRESSION: 1. No cholecystolithiasis or changes of acute cholecystitis. 2. Diffuse hepatic steatosis. Electronically signed by: Rogelia Myers MD 12/27/2023 04:11 PM EST RP Workstation: [email or web address] Alm Schneider, DO  [facility]  If  7PM-7AM, please contact night-coverage [email or web address] Password [email or web address] 12/28/2023, 5:57 PM   LOS: 0 days   "

## 2023-12-29 LAB — BASIC METABOLIC PANEL WITH GFR
Anion gap: 10 (ref 5–15)
BUN: <5 mg/dL — ABNORMAL LOW (ref 6–20)
CO2: 26 mmol/L (ref 22–32)
Calcium: 8.5 mg/dL — ABNORMAL LOW (ref 8.9–10.3)
Chloride: 98 mmol/L (ref 98–111)
Creatinine, Ser: 0.56 mg/dL — ABNORMAL LOW (ref 0.61–1.24)
GFR, Estimated: >60 mL/min
Glucose, Bld: 93 mg/dL (ref 70–99)
Potassium: 3.7 mmol/L (ref 3.5–5.1)
Sodium: 134 mmol/L — ABNORMAL LOW (ref 135–145)

## 2023-12-29 LAB — MAGNESIUM: Magnesium: 1.5 mg/dL — ABNORMAL LOW (ref 1.7–2.4)

## 2023-12-29 MED ORDER — MAGNESIUM OXIDE -MG SUPPLEMENT 400 (240 MG) MG PO TABS: 400.0000 mg | ORAL_TABLET | Freq: Every day | ORAL | 1 refills | Status: AC

## 2023-12-29 MED ORDER — AMLODIPINE BESYLATE 10 MG PO TABS: 10.0000 mg | ORAL_TABLET | Freq: Every day | ORAL | 1 refills | Status: AC

## 2023-12-29 MED ORDER — MAGNESIUM OXIDE -MG SUPPLEMENT 400 (240 MG) MG PO TABS: 400.0000 mg | ORAL_TABLET | Freq: Every day | ORAL | Status: DC

## 2023-12-29 MED ORDER — HYDRALAZINE HCL 50 MG PO TABS: 50.0000 mg | ORAL_TABLET | Freq: Three times a day (TID) | ORAL | 1 refills | Status: AC

## 2023-12-29 MED ORDER — MAGNESIUM SULFATE 2 GM/50ML IV SOLN
2.0000 g | Freq: Once | INTRAVENOUS | Status: AC
  Administered 2023-12-29: 2 g via INTRAVENOUS
  Filled 2023-12-29: qty 50

## 2023-12-29 NOTE — TOC CM/SW Note (Signed)
 Transition of Care [facility]) - Inpatient Brief Assessment   Patient Details  Name: Carlos Good MRN: [id number] Date of Birth: 11/05/1985  Transition of Care [facility]) CM/SW Contact:    Lucie Lunger, LCSWA Phone Number: 12/29/2023, 9:12 AM   Clinical Narrative: Transition of Care Department [facility]) has reviewed patient and no TOC needs have been identified at this time. We will continue to monitor patient advancement through interdiciplinary progression rounds. If new patient transition needs arise, please place a TOC consult.  Transition of Care Asessment: Insurance and Status: Selfpay Patient has primary care physician: No (Free clinic info to be provided) Home environment has been reviewed: From home Prior level of function:: Independent Prior/Current Home Services: No current home services Social Drivers of Health Review: SDOH reviewed no interventions necessary Readmission risk has been reviewed: Yes Transition of care needs: no transition of care needs at this time

## 2023-12-29 NOTE — Plan of Care (Signed)

## 2023-12-29 NOTE — Progress Notes (Signed)
 Patient discharging home. Discharge instructions provided. Verbalizes no concerns

## 2023-12-29 NOTE — Discharge Summary (Addendum)
 " Physician Discharge Summary   Patient: Carlos Good MRN: [id number] DOB: [birth year]  Admit date:     12/27/2023  Discharge date: 12/29/2023  Discharge Physician: Alm Lavida Patch   PCP: Pcp, No   Recommendations at discharge:   Please follow up with primary care provider within 1-2 weeks  Please repeat BMP and CBC in one week     Hospital Course: 38 year old male with history of alcohol abuse, hypertension, poor compliance presenting to emergency department secondary to tremors, nausea, vomiting that started in the early morning 12/27/2023.  The patient states that he drinks a pint of vodka on a daily basis.  His last drink was in the evening of 12/25/2023.  He states that he smokes 2 to 3 cigarettes a day.  He smokes THC 3 times per week.  He denies illicit drugs.  He denies any fevers, chills, headache, chest pain, shortness breath, cough, hemoptysis.  He denies any abdominal pain.  He denies any hematemesis.  He felt like his emesis was bilious in nature without any coffee grounds or bright red blood. There is no hematochezia or melena.  No dysuria or hematuria.  He states that he has not taken his antihypertensive medications in a while.  In the ED, the patient was afebrile and hemodynamically stable with oxygen saturation 90% on room air.  WBC 6.1, hemoglobin 14.1, platelet 142.  Sodium 138, potassium 3.4, bicarbonate 19, serum creatinine 0.53.  AST 140, ALT 50, alk phosphatase 72, total bilirubin 0.8.  COVID-19 PCR is negative.  UA was negative for pyuria.  UDS was positive for THC.  Alcohol level 26. Upon arrival the patient was tremulous and diaphoretic.  He was given Ativan  with some improvement of his symptoms.  Assessment and Plan:  Intractable nausea and vomiting - Secondary to alcoholic gastritis and a component of cannabis hyperemesis syndrome - d/c compazine  - Continue pantoprazole  - Continue IV fluids - UA neg for pyuria - diet gradually advanced to soft diet which pt  tolerated   Alcohol withdrawal - Alcohol withdrawal protocol - overall improved at time of dc   Hypertensive urgency - Patient presented with blood pressure 198/132 - Restart hydralazine  and amlodipine  - hydralazine  increased to 50 mg q 8 hours   Hypokalemia/Hypomagnesemia - Repleted -d/c home with daily mag ox   Alcoholic hepatitis - Monitor LFTs--improving   AGMA - Secondary to alcoholic ketosis - Continued IV fluids>improved   Tobacco abuse - Tobacco cessation discussed   Thrombocytopenia -stable -monitor for bleeding       Consultants: none Procedures performed: none  Disposition: Home Diet recommendation:  Regular diet DISCHARGE MEDICATION: Allergies as of 12/29/2023   No Known Allergies      Medication List     TAKE these medications    amLODipine  10 MG tablet Commonly known as: NORVASC  Take 1 tablet (10 mg total) by mouth daily. Start taking on: December 30, 2023 What changed:  medication strength how much to take   hydrALAZINE  50 MG tablet Commonly known as: APRESOLINE  Take 1 tablet (50 mg total) by mouth every 8 (eight) hours.   magnesium  oxide 400 (240 Mg) MG tablet Commonly known as: MAG-OX Take 1 tablet (400 mg total) by mouth daily. Start taking on: December 30, 2023        Discharge Exam: Fredricka Weights   12/27/23 1109 12/27/23 1446  Weight: 73 kg 73.7 kg   HEENT:  [place]/AT, No thrush, no icterus CV:  RRR, no rub, no S3, no S4 Lung:  CTA, no wheeze, no rhonchi Abd:  soft/+BS, NT Ext:  No edema, no lymphangitis, no synovitis, no rash   Condition at discharge: stable  The results of significant diagnostics from this hospitalization (including imaging, microbiology, ancillary and laboratory) are listed below for reference.   Imaging Studies: US  Abdomen Limited RUQ (LIVER/GB) Result Date: 12/27/2023 EXAM: Right Upper Quadrant Abdominal Ultrasound 12/27/2023 03:34:26 PM TECHNIQUE: Real-time ultrasonography of the right upper  quadrant of the abdomen was performed. COMPARISON: None available. CLINICAL HISTORY: Transaminasemia. FINDINGS: LIVER: Increased echogenicity. No intrahepatic biliary ductal dilatation. No evidence of mass. Hepatopetal flow in the portal vein. BILIARY SYSTEM: Gallbladder wall thickness measures 2.5 mm. No pericholecystic fluid. No cholelithiasis. Common bile duct is within normal limits measuring 2.8 mm. OTHER: No right upper quadrant ascites. IMPRESSION: 1. No cholecystolithiasis or changes of acute cholecystitis. 2. Diffuse hepatic steatosis. Electronically signed by: Rogelia Myers MD 12/27/2023 04:11 PM EST RP Workstation: [id number]    Microbiology: Results for orders placed or performed during the hospital encounter of 12/27/23  Resp panel by RT-PCR (RSV, Flu A&B, Covid) Anterior Nasal Swab     Status: None   Collection Time: 12/27/23 11:14 AM   Specimen: Anterior Nasal Swab  Result Value Ref Range Status   SARS Coronavirus 2 by RT PCR NEGATIVE NEGATIVE Final    Comment: (NOTE) SARS-CoV-2 target nucleic acids are NOT DETECTED.  The SARS-CoV-2 RNA is generally detectable in upper respiratory specimens during the acute phase of infection. The lowest concentration of SARS-CoV-2 viral copies this assay can detect is 138 copies/mL. A negative result does not preclude SARS-Cov-2 infection and should not be used as the sole basis for treatment or other patient management decisions. A negative result may occur with  improper specimen collection/handling, submission of specimen other than nasopharyngeal swab, presence of viral mutation(s) within the areas targeted by this assay, and inadequate number of viral copies(<138 copies/mL). A negative result must be combined with clinical observations, patient history, and epidemiological information. The expected result is Negative.  Fact Sheet for Patients:  bloggercourse.com  Fact Sheet for Healthcare Providers:   seriousbroker.it  This test is no t yet approved or cleared by the [place]  FDA and  has been authorized for detection and/or diagnosis of SARS-CoV-2 by FDA under an Emergency Use Authorization (EUA). This EUA will remain  in effect (meaning this test can be used) for the duration of the COVID-19 declaration under Section 564(b)(1) of the Act, 21 U.S.C.section 360bbb-3(b)(1), unless the authorization is terminated  or revoked sooner.       Influenza A by PCR NEGATIVE NEGATIVE Final   Influenza B by PCR NEGATIVE NEGATIVE Final    Comment: (NOTE) The Xpert Xpress SARS-CoV-2/FLU/RSV plus assay is intended as an aid in the diagnosis of influenza from Nasopharyngeal swab specimens and should not be used as a sole basis for treatment. Nasal washings and aspirates are unacceptable for Xpert Xpress SARS-CoV-2/FLU/RSV testing.  Fact Sheet for Patients: bloggercourse.com  Fact Sheet for Healthcare Providers: seriousbroker.it  This test is not yet approved or cleared by the [place]  FDA and has been authorized for detection and/or diagnosis of SARS-CoV-2 by FDA under an Emergency Use Authorization (EUA). This EUA will remain in effect (meaning this test can be used) for the duration of the COVID-19 declaration under Section 564(b)(1) of the Act, 21 U.S.C. section 360bbb-3(b)(1), unless the authorization is terminated or revoked.     Resp Syncytial Virus by PCR NEGATIVE NEGATIVE Final    Comment: (NOTE)  Fact Sheet for Patients: bloggercourse.com  Fact Sheet for Healthcare Providers: seriousbroker.it  This test is not yet approved or cleared by the [place]  FDA and has been authorized for detection and/or diagnosis of SARS-CoV-2 by FDA under an Emergency Use Authorization (EUA). This EUA will remain in effect (meaning this test can be used) for  the duration of the COVID-19 declaration under Section 564(b)(1) of the Act, 21 U.S.C. section 360bbb-3(b)(1), unless the authorization is terminated or revoked.  Performed at [facility], [address] [place], [place] [address]     Labs: CBC: Recent Labs  Lab 12/27/23 1112 12/28/23 0621  WBC 6.1 4.9  HGB 14.1 12.8*  HCT 40.7 36.4*  MCV 99.5 96.8  PLT 142* 132*   Basic Metabolic Panel: Recent Labs  Lab 12/27/23 1112 12/28/23 0621 12/29/23 0513  NA 138 135 134*  K 3.4* 3.1* 3.7  CL 91* 94* 98  CO2 19* 29 26  GLUCOSE 118* 89 93  BUN 5* <5* <5*  CREATININE 0.53* 0.55* 0.56*  CALCIUM 8.2* 7.6* 8.5*  MG  --  1.2* 1.5*  PHOS  --  3.2  --    Liver Function Tests: Recent Labs  Lab 12/27/23 1112 12/28/23 0621  AST 142* 91*  ALT 50* 38  ALKPHOS 72 57  BILITOT 0.8 1.1  PROT 8.2* 6.6  ALBUMIN 5.1* 4.1   CBG: No results for input(s): GLUCAP in the last 168 hours.  Discharge time spent: greater than 30 minutes.  Signed: Alm Schneider, MD [facility] 12/29/2023 "
